# Patient Record
Sex: Male | Born: 1980 | Race: Black or African American | Hispanic: No | Smoking: Never smoker
Health system: Southern US, Community
[De-identification: ages and names within clinical notes are randomized; demographics above are authoritative.]

## PROBLEM LIST (undated history)

## (undated) DIAGNOSIS — I10 Essential (primary) hypertension: Secondary | ICD-10-CM

## (undated) DIAGNOSIS — M5416 Radiculopathy, lumbar region: Secondary | ICD-10-CM

## (undated) DIAGNOSIS — G8929 Other chronic pain: Secondary | ICD-10-CM

## (undated) DIAGNOSIS — R52 Pain, unspecified: Secondary | ICD-10-CM

## (undated) DIAGNOSIS — J45909 Unspecified asthma, uncomplicated: Secondary | ICD-10-CM

## (undated) DIAGNOSIS — N179 Acute kidney failure, unspecified: Secondary | ICD-10-CM

## (undated) DIAGNOSIS — K859 Acute pancreatitis without necrosis or infection, unspecified: Secondary | ICD-10-CM

## (undated) DIAGNOSIS — M549 Dorsalgia, unspecified: Secondary | ICD-10-CM

## (undated) DIAGNOSIS — F419 Anxiety disorder, unspecified: Secondary | ICD-10-CM

## (undated) DIAGNOSIS — G43909 Migraine, unspecified, not intractable, without status migrainosus: Secondary | ICD-10-CM

## (undated) DIAGNOSIS — N289 Disorder of kidney and ureter, unspecified: Secondary | ICD-10-CM

---

## 2013-12-31 ENCOUNTER — Encounter (HOSPITAL_COMMUNITY): Payer: Self-pay | Admitting: Emergency Medicine

## 2013-12-31 ENCOUNTER — Inpatient Hospital Stay (HOSPITAL_COMMUNITY)
Admission: EM | Admit: 2013-12-31 | Discharge: 2014-01-02 | DRG: 392 | Disposition: A | Discharge status: Home / Self Care | Payer: Medicaid - Out of State | Attending: Internal Medicine | Admitting: Internal Medicine

## 2013-12-31 ENCOUNTER — Emergency Department (HOSPITAL_COMMUNITY): Payer: Medicaid - Out of State

## 2013-12-31 DIAGNOSIS — K529 Noninfective gastroenteritis and colitis, unspecified: Secondary | ICD-10-CM | POA: Diagnosis present

## 2013-12-31 DIAGNOSIS — I10 Essential (primary) hypertension: Secondary | ICD-10-CM | POA: Diagnosis present

## 2013-12-31 DIAGNOSIS — E860 Dehydration: Secondary | ICD-10-CM | POA: Diagnosis present

## 2013-12-31 DIAGNOSIS — R1013 Epigastric pain: Secondary | ICD-10-CM

## 2013-12-31 DIAGNOSIS — J45909 Unspecified asthma, uncomplicated: Secondary | ICD-10-CM | POA: Diagnosis present

## 2013-12-31 DIAGNOSIS — R109 Unspecified abdominal pain: Secondary | ICD-10-CM | POA: Diagnosis present

## 2013-12-31 HISTORY — DX: Acute kidney failure, unspecified: N17.9

## 2013-12-31 HISTORY — DX: Disorder of kidney and ureter, unspecified: N28.9

## 2013-12-31 HISTORY — DX: Essential (primary) hypertension: I10

## 2013-12-31 LAB — URINALYSIS, ROUTINE W REFLEX MICROSCOPIC
BILIRUBIN URINE: NEGATIVE
GLUCOSE, UA: NEGATIVE mg/dL
HGB URINE DIPSTICK: NEGATIVE
Ketones, ur: NEGATIVE mg/dL
Leukocytes, UA: NEGATIVE
Nitrite: NEGATIVE
Protein, ur: NEGATIVE mg/dL
Specific Gravity, Urine: 1.02 (ref 1.005–1.030)
Urobilinogen, UA: 0.2 mg/dL (ref 0.0–1.0)
pH: 6.5 (ref 5.0–8.0)

## 2013-12-31 LAB — COMPREHENSIVE METABOLIC PANEL
ALT: 29 U/L (ref 0–53)
AST: 23 U/L (ref 0–37)
Albumin: 4.3 g/dL (ref 3.5–5.2)
Alkaline Phosphatase: 83 U/L (ref 39–117)
Anion gap: 12 (ref 5–15)
BILIRUBIN TOTAL: 0.6 mg/dL (ref 0.3–1.2)
BUN: 10 mg/dL (ref 6–23)
CHLORIDE: 101 meq/L (ref 96–112)
CO2: 27 meq/L (ref 19–32)
Calcium: 9.6 mg/dL (ref 8.4–10.5)
Creatinine, Ser: 1.29 mg/dL (ref 0.50–1.35)
GFR calc Af Amer: 83 mL/min — ABNORMAL LOW (ref >90)
GFR, EST NON AFRICAN AMERICAN: 72 mL/min — AB (ref >90)
Glucose, Bld: 96 mg/dL (ref 70–99)
Potassium: 4.1 mEq/L (ref 3.7–5.3)
SODIUM: 140 meq/L (ref 137–147)
Total Protein: 7.5 g/dL (ref 6.0–8.3)

## 2013-12-31 LAB — RAPID URINE DRUG SCREEN, HOSP PERFORMED
AMPHETAMINES: NOT DETECTED
BARBITURATES: NOT DETECTED
BENZODIAZEPINES: NOT DETECTED
Cocaine: NOT DETECTED
Opiates: NOT DETECTED
Tetrahydrocannabinol: NOT DETECTED

## 2013-12-31 LAB — GLUCOSE, CAPILLARY: Glucose-Capillary: 126 mg/dL — ABNORMAL HIGH (ref 70–99)

## 2013-12-31 LAB — CBC WITH DIFFERENTIAL/PLATELET
Basophils Absolute: 0 10*3/uL (ref 0.0–0.1)
Basophils Relative: 0 % (ref 0–1)
Eosinophils Absolute: 0.1 10*3/uL (ref 0.0–0.7)
Eosinophils Relative: 3 % (ref 0–5)
HEMATOCRIT: 38.6 % — AB (ref 39.0–52.0)
Hemoglobin: 13.6 g/dL (ref 13.0–17.0)
LYMPHS PCT: 22 % (ref 12–46)
Lymphs Abs: 1.2 10*3/uL (ref 0.7–4.0)
MCH: 29 pg (ref 26.0–34.0)
MCHC: 35.2 g/dL (ref 30.0–36.0)
MCV: 82.3 fL (ref 78.0–100.0)
MONO ABS: 0.4 10*3/uL (ref 0.1–1.0)
Monocytes Relative: 7 % (ref 3–12)
NEUTROS ABS: 3.5 10*3/uL (ref 1.7–7.7)
Neutrophils Relative %: 68 % (ref 43–77)
Platelets: 222 10*3/uL (ref 150–400)
RBC: 4.69 MIL/uL (ref 4.22–5.81)
RDW: 12.6 % (ref 11.5–15.5)
WBC: 5.2 10*3/uL (ref 4.0–10.5)

## 2013-12-31 LAB — LIPASE, BLOOD: Lipase: 58 U/L (ref 11–59)

## 2013-12-31 MED ORDER — SODIUM CHLORIDE 0.9 % IV BOLUS (SEPSIS)
1000.0000 mL | Freq: Once | INTRAVENOUS | Status: AC
Start: 2013-12-31 — End: 2013-12-31
  Administered 2013-12-31: 1000 mL via INTRAVENOUS

## 2013-12-31 MED ORDER — MOMETASONE FURO-FORMOTEROL FUM 200-5 MCG/ACT IN AERO
INHALATION_SPRAY | RESPIRATORY_TRACT | Status: AC
  Filled 2013-12-31: qty 8.8

## 2013-12-31 MED ORDER — IOHEXOL 300 MG/ML  SOLN
100.0000 mL | Freq: Once | INTRAMUSCULAR | Status: AC | PRN
  Administered 2013-12-31: 100 mL via INTRAVENOUS

## 2013-12-31 MED ORDER — ACETAMINOPHEN 650 MG RE SUPP: 650.0000 mg | Freq: Four times a day (QID) | RECTAL | Status: DC | PRN

## 2013-12-31 MED ORDER — ONDANSETRON HCL 4 MG/2ML IJ SOLN
4.0000 mg | Freq: Once | INTRAMUSCULAR | Status: AC
  Administered 2013-12-31: 4 mg via INTRAVENOUS
  Filled 2013-12-31: qty 2

## 2013-12-31 MED ORDER — HYDROMORPHONE HCL 1 MG/ML IJ SOLN
1.0000 mg | Freq: Once | INTRAMUSCULAR | Status: AC
  Administered 2013-12-31: 1 mg via INTRAVENOUS
  Filled 2013-12-31: qty 1

## 2013-12-31 MED ORDER — ENOXAPARIN SODIUM 40 MG/0.4ML ~~LOC~~ SOLN
40.0000 mg | SUBCUTANEOUS | Status: DC
  Administered 2013-12-31 – 2014-01-01 (×2): 40 mg via SUBCUTANEOUS
  Filled 2013-12-31 (×2): qty 0.4

## 2013-12-31 MED ORDER — ACETAMINOPHEN 325 MG PO TABS: 650.0000 mg | ORAL_TABLET | Freq: Four times a day (QID) | ORAL | Status: DC | PRN

## 2013-12-31 MED ORDER — QUETIAPINE FUMARATE 100 MG PO TABS
200.0000 mg | ORAL_TABLET | Freq: Every day | ORAL | Status: DC
Start: 2013-12-31 — End: 2014-01-02
  Administered 2013-12-31 – 2014-01-01 (×2): 200 mg via ORAL
  Filled 2013-12-31 (×2): qty 2

## 2013-12-31 MED ORDER — IOHEXOL 300 MG/ML  SOLN
50.0000 mL | Freq: Once | INTRAMUSCULAR | Status: AC | PRN
  Administered 2013-12-31: 50 mL via ORAL

## 2013-12-31 MED ORDER — CIPROFLOXACIN IN D5W 400 MG/200ML IV SOLN
400.0000 mg | Freq: Once | INTRAVENOUS | Status: AC
  Administered 2013-12-31: 400 mg via INTRAVENOUS
  Filled 2013-12-31: qty 200

## 2013-12-31 MED ORDER — METRONIDAZOLE IN NACL 5-0.79 MG/ML-% IV SOLN
500.0000 mg | Freq: Three times a day (TID) | INTRAVENOUS | Status: DC
  Administered 2013-12-31 – 2014-01-02 (×5): 500 mg via INTRAVENOUS
  Filled 2013-12-31 (×6): qty 100

## 2013-12-31 MED ORDER — ALUM & MAG HYDROXIDE-SIMETH 200-200-20 MG/5ML PO SUSP: 30.0000 mL | Freq: Four times a day (QID) | ORAL | Status: DC | PRN

## 2013-12-31 MED ORDER — PANTOPRAZOLE SODIUM 40 MG PO TBEC
40.0000 mg | DELAYED_RELEASE_TABLET | Freq: Every day | ORAL | Status: DC
  Administered 2014-01-01 – 2014-01-02 (×2): 40 mg via ORAL
  Filled 2013-12-31 (×2): qty 1

## 2013-12-31 MED ORDER — MORPHINE SULFATE 2 MG/ML IJ SOLN
2.0000 mg | INTRAMUSCULAR | Status: DC | PRN
  Administered 2013-12-31 – 2014-01-02 (×7): 2 mg via INTRAVENOUS
  Filled 2013-12-31 (×7): qty 1

## 2013-12-31 MED ORDER — CIPROFLOXACIN IN D5W 400 MG/200ML IV SOLN
400.0000 mg | Freq: Two times a day (BID) | INTRAVENOUS | Status: DC
  Administered 2013-12-31 – 2014-01-02 (×4): 400 mg via INTRAVENOUS
  Filled 2013-12-31 (×4): qty 200

## 2013-12-31 MED ORDER — SODIUM CHLORIDE 0.9 % IV BOLUS (SEPSIS)
1000.0000 mL | Freq: Once | INTRAVENOUS | Status: AC
  Administered 2013-12-31: 1000 mL via INTRAVENOUS

## 2013-12-31 MED ORDER — HYDROMORPHONE HCL 1 MG/ML IJ SOLN
1.0000 mg | Freq: Once | INTRAMUSCULAR | Status: AC
Start: 2013-12-31 — End: 2013-12-31
  Administered 2013-12-31: 1 mg via INTRAVENOUS
  Filled 2013-12-31: qty 1

## 2013-12-31 MED ORDER — SODIUM CHLORIDE 0.9 % IV SOLN
INTRAVENOUS | Status: DC
  Administered 2013-12-31 – 2014-01-02 (×4): via INTRAVENOUS

## 2013-12-31 MED ORDER — MOMETASONE FURO-FORMOTEROL FUM 200-5 MCG/ACT IN AERO
2.0000 | INHALATION_SPRAY | Freq: Two times a day (BID) | RESPIRATORY_TRACT | Status: DC
  Administered 2013-12-31 – 2014-01-02 (×4): 2 via RESPIRATORY_TRACT
  Filled 2013-12-31: qty 8.8

## 2013-12-31 MED ORDER — METRONIDAZOLE IN NACL 5-0.79 MG/ML-% IV SOLN: 500.0000 mg | Freq: Once | INTRAVENOUS | Status: DC

## 2013-12-31 MED ORDER — BUSPIRONE HCL 5 MG PO TABS
10.0000 mg | ORAL_TABLET | Freq: Three times a day (TID) | ORAL | Status: DC
  Administered 2013-12-31 – 2014-01-02 (×5): 10 mg via ORAL
  Filled 2013-12-31 (×5): qty 2

## 2013-12-31 MED ORDER — VERAPAMIL HCL 80 MG PO TABS
80.0000 mg | ORAL_TABLET | Freq: Three times a day (TID) | ORAL | Status: DC
  Administered 2013-12-31 – 2014-01-02 (×5): 80 mg via ORAL
  Filled 2013-12-31 (×5): qty 1

## 2013-12-31 MED ORDER — MONTELUKAST SODIUM 10 MG PO TABS
10.0000 mg | ORAL_TABLET | Freq: Every day | ORAL | Status: DC
  Administered 2013-12-31 – 2014-01-01 (×2): 10 mg via ORAL
  Filled 2013-12-31 (×2): qty 1

## 2013-12-31 MED ORDER — METOPROLOL TARTRATE 25 MG PO TABS
25.0000 mg | ORAL_TABLET | Freq: Two times a day (BID) | ORAL | Status: DC
  Administered 2013-12-31 – 2014-01-02 (×4): 25 mg via ORAL
  Filled 2013-12-31 (×4): qty 1

## 2013-12-31 MED ORDER — TIZANIDINE HCL 4 MG PO TABS
4.0000 mg | ORAL_TABLET | Freq: Three times a day (TID) | ORAL | Status: DC | PRN
  Administered 2014-01-01: 4 mg via ORAL
  Filled 2013-12-31: qty 1

## 2013-12-31 MED ORDER — OXYCODONE HCL 5 MG PO TABS
5.0000 mg | ORAL_TABLET | ORAL | Status: DC | PRN
  Administered 2013-12-31 – 2014-01-01 (×4): 5 mg via ORAL
  Filled 2013-12-31 (×4): qty 1

## 2013-12-31 MED ORDER — CITALOPRAM HYDROBROMIDE 20 MG PO TABS
40.0000 mg | ORAL_TABLET | Freq: Every day | ORAL | Status: DC
  Administered 2013-12-31 – 2014-01-01 (×2): 40 mg via ORAL
  Filled 2013-12-31 (×2): qty 2

## 2013-12-31 NOTE — ED Notes (Signed)
Pt reports abdominal pain and n/v/d x1 month. Pt reports [facility] for [facility]. Pt reports was diagnosed with pancreatitis.

## 2013-12-31 NOTE — ED Notes (Signed)
EDP aware of BP. 

## 2013-12-31 NOTE — ED Provider Notes (Signed)
CSN: [id number]     Arrival date & time 12/31/13  1100 History   First MD Initiated Contact with Patient 12/31/13 1309     Chief Complaint  Patient presents with  . Abdominal Pain     (Consider location/radiation/quality/duration/timing/severity/associated sxs/prior Treatment) HPI.... Abdominal pain, nausea, vomiting for one month. [facility] with the following diagnoses:  "Pancreatitis, pleural effusion, kidney disease"   He was recently discharged AMA.  His symptoms have persisted. He claims not to be eating or drinking. Also complains of back pain left greater than right. No fever, chills chest pain, dyspnea, dysuria.  Past Medical History  Diagnosis Date  . Hypertension   . Renal disorder   . Acute renal failure    History reviewed. No pertinent past surgical history. History reviewed. No pertinent family history. History  Substance Use Topics  . Smoking status: Never Smoker   . Smokeless tobacco: Not on file  . Alcohol Use: No    Review of Systems  All other systems reviewed and are negative.     Allergies  Adhesive; Bee venom; Dilaudid; Morphine and related; and Penicillins  Home Medications   Prior to Admission medications   Medication Sig Start Date End Date Taking? Authorizing Provider  albuterol (PROVENTIL) (2.5 MG/3ML) 0.083% nebulizer solution Inhale 2.5 mg into the lungs every 4 (four) hours as needed. 02/03/13  Yes Historical Provider, MD  albuterol (VENTOLIN HFA) 108 (90 BASE) MCG/ACT inhaler Inhale 2 puffs into the lungs every 6 (six) hours as needed. 10/23/13  Yes Historical Provider, MD  busPIRone (BUSPAR) 10 MG tablet Take 10 mg by mouth 3 (three) times daily.   Yes Historical Provider, MD  citalopram (CELEXA) 40 MG tablet Take 40 mg by mouth at bedtime.   Yes Historical Provider, MD  Fluticasone-Salmeterol (ADVAIR) 500-50 MCG/DOSE AEPB Inhale 1 puff into the lungs at bedtime.   Yes Historical  Provider, MD  montelukast (SINGULAIR) 10 MG tablet Take 10 mg by mouth at bedtime. 10/23/13  Yes Historical Provider, MD  QUEtiapine (SEROQUEL) 200 MG tablet Take 200 mg by mouth at bedtime.   Yes Historical Provider, MD  tiZANidine (ZANAFLEX) 4 MG tablet Take 4 mg by mouth every 8 (eight) hours as needed for muscle spasms.   Yes Historical Provider, MD  verapamil (CALAN) 80 MG tablet Take 80 mg by mouth 3 (three) times daily.   Yes Historical Provider, MD  metoprolol tartrate (LOPRESSOR) 25 MG tablet Take 25 mg by mouth 2 (two) times daily.    Historical Provider, MD  oxyCODONE (OXY IR/ROXICODONE) 5 MG immediate release tablet Take 5 mg by mouth. Take 1 tablet every 4 hours as needed for pain 12/16/13   Historical Provider, MD   BP 154/107 mmHg  Pulse 75  Temp(Src) 98.2 F (36.8 C) (Oral)  Resp 16  Ht 6\' 1"  (1.854 m)  Wt 180 lb (81.647 kg)  BMI 23.75 kg/m2  SpO2 98% Physical Exam  Constitutional: He is oriented to person, place, and time. He appears well-developed and well-nourished.  HENT:  Head: Normocephalic and atraumatic.  Eyes: Conjunctivae and EOM are normal. Pupils are equal, round, and reactive to light.  Neck: Normal range of motion. Neck supple.  Cardiovascular: Normal rate, regular rhythm and normal heart sounds.   Pulmonary/Chest: Effort normal and breath sounds normal.  Abdominal: Soft. Bowel sounds are normal.  Tenderness right side of abdomen  Musculoskeletal: Normal range of motion.  Neurological: He is alert and oriented  to person, place, and time.  Skin: Skin is warm and dry.  Psychiatric: He has a normal mood and affect. His behavior is normal.  Nursing note and vitals reviewed.   ED Course  Procedures (including critical care time) Labs Review Labs Reviewed  CBC WITH DIFFERENTIAL - Abnormal; Notable for the following:    HCT 38.6 (*)    All other components within normal limits  COMPREHENSIVE METABOLIC PANEL - Abnormal; Notable for the following:    GFR  calc non Af Amer 72 (*)    GFR calc Af Amer 83 (*)    All other components within normal limits  LIPASE, BLOOD  URINALYSIS, ROUTINE W REFLEX MICROSCOPIC  URINE RAPID DRUG SCREEN (HOSP PERFORMED)    Imaging Review Dg Chest 2 View  12/31/2013   CLINICAL DATA:  Acute abdominal and back pain.  EXAM: CHEST - 2 VIEW  COMPARISON:  12/31/2013  FINDINGS: Mild hyperinflation. No focal pneumonia, collapse or consolidation. Normal heart size and vascularity. No edema, effusion pneumothorax. Trachea midline.  IMPRESSION: Hyperinflation without acute process.   Electronically Signed   By: Ruel Favorsrevor  Shick M.D.   On: 12/31/2013 15:19   Ct Abdomen Pelvis W Contrast  12/31/2013   CLINICAL DATA:  Abdominal pain, nausea, vomiting and diarrhea for 1 month.  EXAM: CT ABDOMEN AND PELVIS WITH CONTRAST  TECHNIQUE: Multidetector CT imaging of the abdomen and pelvis was performed using the standard protocol following bolus administration of intravenous contrast.  CONTRAST:  50mL OMNIPAQUE IOHEXOL 300 MG/ML SOLN, 100mL OMNIPAQUE IOHEXOL 300 MG/ML SOLN  COMPARISON:  None.  FINDINGS: There are trace bilateral pleural effusions. The lung bases are clear.  The liver demonstrates no focal abnormality. There is no intrahepatic or extrahepatic biliary ductal dilatation. The gallbladder is normal. The spleen demonstrates no focal abnormality. The kidneys, adrenal glands and pancreas are normal. The bladder is unremarkable.  The stomach is distended with a majority of the oral contrast present within the stomach. The duodenum, small intestine, and large intestine demonstrate no contrast extravasation, bowel thickening or dilatation. There are a few fluid-filled loops of small bowel with air-fluid levels and fluid within the ascending colon. There is a normal caliber appendix in the right lower quadrant without periappendiceal inflammatory changes. There is no pneumoperitoneum, pneumatosis, or portal venous gas. There is no abdominal or  pelvic free fluid. There is no lymphadenopathy.  The abdominal aorta is normal in caliber.  There are no lytic or sclerotic osseous lesions. 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  IMPRESSION: 1. There are a few fluid-filled loops of small bowel with air-fluid levels and fluid within the ascending colon. This appearance can be seen with enterocolitis.   Electronically Signed   By: Elige KoHetal  Patel   On: 12/31/2013 15:06     EKG Interpretation None      MDM   Final diagnoses:  Abdominal pain    White count, hepatic functions, lipase all normal. CT scan shows air-fluid levels and fluid within the ascending colon consistent with enterocolitis. IV Flagyl, IV Cipro. Admit to general medicine.    Donnetta HutchingBrian Dshawn Mcnay, MD 12/31/13 [phone]

## 2013-12-31 NOTE — H&P (Signed)
[facility] History and Physical  Dontravious Camille ZOX:[id number] DOB: 07-26-1980 DOA: 12/31/2013  Referring physician:  PCP: No primary care provider on file.   Chief Complaint: Abdominal pain  HPI: Patrick Black is a 33 y.o. male  with a medical history of hypertension, acute renal failure, having a recent hospitalizations in [place] and [place] for abdominal pain. He states being diagnosed with pancreatitis. He reports leaving AGAINST MEDICAL ADVICE yesterday evening from a [facility], stating that he did not want another CT scan. He presents with complaints of generalized abdominal pain, becoming progressively worse over the past 2 weeks that has been associated with nausea vomiting and diarrhea. He denies bloody stools, melena, hematemesis along with fevers or chills, sick contacts or recent travel. Initial workup included a CT scan of abdomen and pelvis that showed findings suggestive of colitis. He also complains of lower back pain that he has had for greater than a month attributing it to arthritis. Blood work performed in the emergency department showed a white count of 5200, LFTs within normal limits, lipase of 58.                                                                                                                                                                                                                       Review of Systems:  Constitutional:  No weight loss, night sweats, Fevers, chills, fatigue.  HEENT:  No headaches, Difficulty swallowing,Tooth/dental problems,Sore throat,  No sneezing, itching, ear ache, nasal congestion, post nasal drip,  Cardio-vascular:  No chest pain, Orthopnea, PND, swelling in lower extremities, anasarca, dizziness, palpitations  GI:  No heartburn, indigestion, positive for abdominal pain, nausea, vomiting, diarrhea, change in bowel habits, loss of appetite  Resp:  No shortness of breath with exertion or at rest. No  excess mucus, no productive cough, No non-productive cough, No coughing up of blood.No change in color of mucus.No wheezing.No chest wall deformity  Skin:  no rash or lesions.  GU:  no dysuria, change in color of urine, no urgency or frequency. No flank pain.  Musculoskeletal:  No joint pain or swelling. No decreased range of motion. Positive for back pain  Psych:  No change in mood or affect. No depression or anxiety. No memory loss.   Past Medical History  Diagnosis Date  . Hypertension   . Renal disorder   . Acute renal failure    History reviewed. No pertinent past surgical history. Social History:  reports that he has never  smoked. He does not have any smokeless tobacco history on file. He reports that he does not drink alcohol or use illicit drugs.  Allergies  Allergen Reactions  . Adhesive [Tape] Dermatitis  . Bee Venom   . Dilaudid [Hydromorphone Hcl]     hives  . Morphine And Related     Hives   . Penicillins     Hives     History reviewed. No pertinent family history.   Prior to Admission medications   Medication Sig Start Date End Date Taking? Authorizing Provider  albuterol (PROVENTIL) (2.5 MG/3ML) 0.083% nebulizer solution Inhale 2.5 mg into the lungs every 4 (four) hours as needed. 02/03/13  Yes Historical Provider, MD  albuterol (VENTOLIN HFA) 108 (90 BASE) MCG/ACT inhaler Inhale 2 puffs into the lungs every 6 (six) hours as needed. 10/23/13  Yes Historical Provider, MD  busPIRone (BUSPAR) 10 MG tablet Take 10 mg by mouth 3 (three) times daily.   Yes Historical Provider, MD  citalopram (CELEXA) 40 MG tablet Take 40 mg by mouth at bedtime.   Yes Historical Provider, MD  Fluticasone-Salmeterol (ADVAIR) 500-50 MCG/DOSE AEPB Inhale 1 puff into the lungs at bedtime.   Yes Historical Provider, MD  montelukast (SINGULAIR) 10 MG tablet Take 10 mg by mouth at bedtime. 10/23/13  Yes Historical Provider, MD  QUEtiapine (SEROQUEL) 200 MG tablet Take 200 mg by mouth at  bedtime.   Yes Historical Provider, MD  tiZANidine (ZANAFLEX) 4 MG tablet Take 4 mg by mouth every 8 (eight) hours as needed for muscle spasms.   Yes Historical Provider, MD  verapamil (CALAN) 80 MG tablet Take 80 mg by mouth 3 (three) times daily.   Yes Historical Provider, MD  metoprolol tartrate (LOPRESSOR) 25 MG tablet Take 25 mg by mouth 2 (two) times daily.    Historical Provider, MD  oxyCODONE (OXY IR/ROXICODONE) 5 MG immediate release tablet Take 5 mg by mouth. Take 1 tablet every 4 hours as needed for pain 12/16/13   Historical Provider, MD   Physical Exam: Filed Vitals:   12/31/13 1129 12/31/13 1813  BP: 139/90 154/107  Pulse: 70 75  Temp: 98.2 F (36.8 C)   TempSrc: Oral   Resp: 18 16  Height: 6\' 1"  (1.854 m)   Weight: 81.647 kg (180 lb)   SpO2: 100% 98%    Wt Readings from Last 3 Encounters:  12/31/13 81.647 kg (180 lb)    General:  Appears calm and comfortable, no acute distress Eyes: PERRL, normal lids, irises & conjunctiva ENT: grossly normal hearing, lips & tongue Neck: no LAD, masses or thyromegaly Cardiovascular: RRR, no m/r/g. No LE edema. Telemetry: SR, no arrhythmias  Respiratory: CTA bilaterally, no w/r/r. Normal respiratory effort. Abdomen: soft, reports generalized pain with palpation, no rebound tenderness or guarding, having positive bowel sounds Skin: no rash or induration seen on limited exam Musculoskeletal: grossly normal tone BUE/BLE Psychiatric: grossly normal mood and affect, speech fluent and appropriate Neurologic: grossly non-focal.          Labs on Admission:  Basic Metabolic Panel:  Recent Labs Lab 12/31/13 1300  NA 140  K 4.1  CL 101  CO2 27  GLUCOSE 96  BUN 10  CREATININE 1.29  CALCIUM 9.6   Liver Function Tests:  Recent Labs Lab 12/31/13 1300  AST 23  ALT 29  ALKPHOS 83  BILITOT 0.6  PROT 7.5  ALBUMIN 4.3    Recent Labs Lab 12/31/13 1300  LIPASE 58   No results for input(s):  AMMONIA in the last 168  hours. CBC:  Recent Labs Lab 12/31/13 1300  WBC 5.2  NEUTROABS 3.5  HGB 13.6  HCT 38.6*  MCV 82.3  PLT 222   Cardiac Enzymes: No results for input(s): CKTOTAL, CKMB, CKMBINDEX, TROPONINI in the last 168 hours.  BNP (last 3 results) No results for input(s): PROBNP in the last 8760 hours. CBG: No results for input(s): GLUCAP in the last 168 hours.  Radiological Exams on Admission: Dg Chest 2 View  12/31/2013   CLINICAL DATA:  Acute abdominal and back pain.  EXAM: CHEST - 2 VIEW  COMPARISON:  12/31/2013  FINDINGS: Mild hyperinflation. No focal pneumonia, collapse or consolidation. Normal heart size and vascularity. No edema, effusion pneumothorax. Trachea midline.  IMPRESSION: Hyperinflation without acute process.   Electronically Signed   By: Ruel Favorsrevor  Shick M.D.   On: 12/31/2013 15:19   Ct Abdomen Pelvis W Contrast  12/31/2013   CLINICAL DATA:  Abdominal pain, nausea, vomiting and diarrhea for 1 month.  EXAM: CT ABDOMEN AND PELVIS WITH CONTRAST  TECHNIQUE: Multidetector CT imaging of the abdomen and pelvis was performed using the standard protocol following bolus administration of intravenous contrast.  CONTRAST:  50mL OMNIPAQUE IOHEXOL 300 MG/ML SOLN, 100mL OMNIPAQUE IOHEXOL 300 MG/ML SOLN  COMPARISON:  None.  FINDINGS: There are trace bilateral pleural effusions. The lung bases are clear.  The liver demonstrates no focal abnormality. There is no intrahepatic or extrahepatic biliary ductal dilatation. The gallbladder is normal. The spleen demonstrates no focal abnormality. The kidneys, adrenal glands and pancreas are normal. The bladder is unremarkable.  The stomach is distended with a majority of the oral contrast present within the stomach. The duodenum, small intestine, and large intestine demonstrate no contrast extravasation, bowel thickening or dilatation. There are a few fluid-filled loops of small bowel with air-fluid levels and fluid within the ascending colon. There is a normal  caliber appendix in the right lower quadrant without periappendiceal inflammatory changes. There is no pneumoperitoneum, pneumatosis, or portal venous gas. There is no abdominal or pelvic free fluid. There is no lymphadenopathy.  The abdominal aorta is normal in caliber.  There are no lytic or sclerotic osseous lesions. 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  IMPRESSION: 1. There are a few fluid-filled loops of small bowel with air-fluid levels and fluid within the ascending colon. This appearance can be seen with enterocolitis.   Electronically Signed   By: Elige KoHetal  Patel   On: 12/31/2013 15:06    EKG: Independently reviewed.   Assessment/Plan Principal Problem:   Abdominal pain Active Problems:   Enterocolitis   Dehydration   1. Abdominal pain. Patient reporting a two-week history of abdominal pain that has been associate with nausea vomiting and diarrhea. It appears she has had previous hospitalizations, signing out [place] MEDICAL ADVICE [place]. A CT scan performed in the emergency room today showing findings suggestive of enterocolitis. Lab work otherwise unremarkable, liver function tests, lipase, white count within normal limits. Will treat empirically with IV ciprofloxacin and Flagyl, IV fluids, supportive care, reassess in a.m. May benefit from a GI consultation if there is no improvement to his symptoms.  2. Hypertension. Will continue metoprolol 25 mg by mouth twice a day and verapamil 80 mg by mouth 3 times a day 3. Asthma. Stable, will continue inhaled steroid.    Code Status: Full code DVT Prophylaxis: Subcutaneous Lovenox Family Communication: Disposition Plan: Admit patient to the medicine service, anticipate may require greater than 2 nights  hospitalization  Time spent: 65 min  Jeralyn BennettZAMORA, Zola Runion [facility] Pager [phone]

## 2014-01-01 DIAGNOSIS — R1084 Generalized abdominal pain: Secondary | ICD-10-CM

## 2014-01-01 LAB — CBC
HEMATOCRIT: 34.4 % — AB (ref 39.0–52.0)
Hemoglobin: 12 g/dL — ABNORMAL LOW (ref 13.0–17.0)
MCH: 28.7 pg (ref 26.0–34.0)
MCHC: 34.9 g/dL (ref 30.0–36.0)
MCV: 82.3 fL (ref 78.0–100.0)
Platelets: 182 10*3/uL (ref 150–400)
RBC: 4.18 MIL/uL — ABNORMAL LOW (ref 4.22–5.81)
RDW: 12.7 % (ref 11.5–15.5)
WBC: 3.6 10*3/uL — AB (ref 4.0–10.5)

## 2014-01-01 LAB — BASIC METABOLIC PANEL
Anion gap: 10 (ref 5–15)
BUN: 11 mg/dL (ref 6–23)
CO2: 25 meq/L (ref 19–32)
CREATININE: 1.21 mg/dL (ref 0.50–1.35)
Calcium: 8.5 mg/dL (ref 8.4–10.5)
Chloride: 107 mEq/L (ref 96–112)
GFR calc Af Amer: 90 mL/min — ABNORMAL LOW (ref >90)
GFR, EST NON AFRICAN AMERICAN: 77 mL/min — AB (ref >90)
GLUCOSE: 107 mg/dL — AB (ref 70–99)
Potassium: 4.2 mEq/L (ref 3.7–5.3)
SODIUM: 142 meq/L (ref 137–147)

## 2014-01-01 LAB — C-REACTIVE PROTEIN: CRP: <0.5 mg/dL — ABNORMAL LOW (ref <0.60)

## 2014-01-01 NOTE — Progress Notes (Signed)
[facility] PROGRESS NOTE  Patrick Chamberlainimothy Black ZOX:[id number] DOB: 12/14/1980 DOA: 12/31/2013 PCP: No primary care provider on file.  Assessment/Plan: Abdominal Pain -2/2 colitis. -Improved.  Colitis -Continue IV cipro/flagyl. -Advance diet  Code Status: Full Code Family Communication: Patient only  Disposition Plan: Home in 1-2 days   Consultants:  None   Antibiotics:  Cipro  Flagyl    Subjective: Abdominal pain improved. Wants to eat.  Objective: Filed Vitals:   12/31/13 2217 01/01/14 0625 01/01/14 0731 01/01/14 1233  BP: 135/73 123/64  123/72  Pulse: 69 65  56  Temp: 98.4 F (36.9 C) 98.1 F (36.7 C)  98.5 F (36.9 C)  TempSrc: Oral Oral  Oral  Resp: 16 16  16   Height:      Weight: 79.924 kg (176 lb 3.2 oz)     SpO2: 98% 98% 95% 98%    Intake/Output Summary (Last 24 hours) at 01/01/14 1536 Last data filed at 01/01/14 1356  Gross per 24 hour  Intake    940 ml  Output    900 ml  Net     40 ml   Filed Weights   12/31/13 1129 12/31/13 2217  Weight: 81.647 kg (180 lb) 79.924 kg (176 lb 3.2 oz)    Exam:   General:  AA Ox3, NAD  Cardiovascular: RRR  Respiratory: CTA B  Abdomen: S/NT/ND/+BS  Extremities: no C/C/E   Neurologic:  Non-focal  Data Reviewed: Basic Metabolic Panel:  Recent Labs Lab 12/31/13 1300 01/01/14 0616  NA 140 142  K 4.1 4.2  CL 101 107  CO2 27 25  GLUCOSE 96 107*  BUN 10 11  CREATININE 1.29 1.21  CALCIUM 9.6 8.5   Liver Function Tests:  Recent Labs Lab 12/31/13 1300  AST 23  ALT 29  ALKPHOS 83  BILITOT 0.6  PROT 7.5  ALBUMIN 4.3    Recent Labs Lab 12/31/13 1300  LIPASE 58   No results for input(s): AMMONIA in the last 168 hours. CBC:  Recent Labs Lab 12/31/13 1300 01/01/14 0616  WBC 5.2 3.6*  NEUTROABS 3.5  --   HGB 13.6 12.0*  HCT 38.6* 34.4*  MCV 82.3 82.3  PLT 222 182   Cardiac Enzymes: No results for input(s): CKTOTAL, CKMB, CKMBINDEX, TROPONINI in the last 168  hours. BNP (last 3 results) No results for input(s): PROBNP in the last 8760 hours. CBG:  Recent Labs Lab 12/31/13 2053  GLUCAP 126*    No results found for this or any previous visit (from the past 240 hour(s)).   Studies: Dg Chest 2 View  12/31/2013   CLINICAL DATA:  Acute abdominal and back pain.  EXAM: CHEST - 2 VIEW  COMPARISON:  12/31/2013  FINDINGS: Mild hyperinflation. No focal pneumonia, collapse or consolidation. Normal heart size and vascularity. No edema, effusion pneumothorax. Trachea midline.  IMPRESSION: Hyperinflation without acute process.   Electronically Signed   By: Ruel Favorsrevor  Shick M.D.   On: 12/31/2013 15:19   Ct Abdomen Pelvis W Contrast  12/31/2013   CLINICAL DATA:  Abdominal pain, nausea, vomiting and diarrhea for 1 month.  EXAM: CT ABDOMEN AND PELVIS WITH CONTRAST  TECHNIQUE: Multidetector CT imaging of the abdomen and pelvis was performed using the standard protocol following bolus administration of intravenous contrast.  CONTRAST:  50mL OMNIPAQUE IOHEXOL 300 MG/ML SOLN, 100mL OMNIPAQUE IOHEXOL 300 MG/ML SOLN  COMPARISON:  None.  FINDINGS: There are trace bilateral pleural effusions. The lung bases are clear.  The liver demonstrates no focal  abnormality. There is no intrahepatic or extrahepatic biliary ductal dilatation. The gallbladder is normal. The spleen demonstrates no focal abnormality. The kidneys, adrenal glands and pancreas are normal. The bladder is unremarkable.  The stomach is distended with a majority of the oral contrast present within the stomach. The duodenum, small intestine, and large intestine demonstrate no contrast extravasation, bowel thickening or dilatation. There are a few fluid-filled loops of small bowel with air-fluid levels and fluid within the ascending colon. There is a normal caliber appendix in the right lower quadrant without periappendiceal inflammatory changes. There is no pneumoperitoneum, pneumatosis, or portal venous gas. There is no  abdominal or pelvic free fluid. There is no lymphadenopathy.  The abdominal aorta is normal in caliber.  There are no lytic or sclerotic osseous lesions. 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  IMPRESSION: 1. There are a few fluid-filled loops of small bowel with air-fluid levels and fluid within the ascending colon. This appearance can be seen with enterocolitis.   Electronically Signed   By: Elige KoHetal  Patel   On: 12/31/2013 15:06    Scheduled Meds: . busPIRone  10 mg Oral TID  . ciprofloxacin  400 mg Intravenous Q12H  . citalopram  40 mg Oral QHS  . enoxaparin (LOVENOX) injection  40 mg Subcutaneous Q24H  . metoprolol tartrate  25 mg Oral BID  . metronidazole  500 mg Intravenous Q8H  . mometasone-formoterol  2 puff Inhalation BID  . montelukast  10 mg Oral QHS  . pantoprazole  40 mg Oral Daily  . QUEtiapine  200 mg Oral QHS  . verapamil  80 mg Oral TID   Continuous Infusions: . sodium chloride 125 mL/hr at 01/01/14 [id number]    Principal Problem:   Abdominal pain Active Problems:   Enterocolitis   Dehydration    Time spent: 35 minutes. Greater than 50% of this time was spent in direct contact with the patient coordinating care.    Chaya JanHERNANDEZ ACOSTA,Duwayne Matters  [facility] Pager [phone]  If 7PM-7AM, please contact night-coverage at [email or web address], password [email or web address] 01/01/2014, 3:36 PM  LOS: 1 day

## 2014-01-01 NOTE — Plan of Care (Signed)
Problem: Phase I Progression Outcomes Goal: Pain controlled with appropriate interventions Outcome: Progressing Goal: OOB as tolerated unless otherwise ordered Outcome: Progressing     

## 2014-01-01 NOTE — Progress Notes (Signed)
UR chart review completed.  

## 2014-01-01 NOTE — Care Management Note (Addendum)
    Page 1 of 1   01/02/2014     11:48:17 AM CARE MANAGEMENT NOTE 01/02/2014  Patient:  Wanita ChamberlainROYAL,Erbie   Account Number:  [id number]  Date Initiated:  01/01/2014  Documentation initiated by:  Sharrie RothmanBLACKWELL,Merie Wulf C  Subjective/Objective Assessment:   Pt admitted from home with abd pain. Pt lives with his significant other and will return home at discharge. Pt has a walker for home use.     Action/Plan:   No CM needs noted.   Anticipated DC Date:  01/03/2014   Anticipated DC Plan:  HOME/SELF CARE      DC Planning Services  CM consult      Choice offered to / List presented to:             Status of service:  Completed, signed off Medicare Important Message given?   (If response is "NO", the following Medicare IM given date fields will be blank) Date Medicare IM given:   Medicare IM given by:   Date Additional Medicare IM given:   Additional Medicare IM given by:    Discharge Disposition:  HOME/SELF CARE  Per UR Regulation:    If discussed at Long Length of Stay Meetings, dates discussed:    Comments:  01/02/14 1145 Arlyss Queenammy Zadyn Yardley, RN BSN CM Pt discharged home today. Pt given list of PCP accepting pts. No other CM needs noted.  01/01/14 1050 Arlyss Queenammy Daniesha Driver, RN BSN CM

## 2014-01-02 MED ORDER — ONDANSETRON 8 MG PO TBDP: 8.0000 mg | ORAL_TABLET | Freq: Three times a day (TID) | ORAL | Status: DC | PRN

## 2014-01-02 MED ORDER — CIPROFLOXACIN HCL 500 MG PO TABS: 500.0000 mg | ORAL_TABLET | Freq: Two times a day (BID) | ORAL | Status: DC

## 2014-01-02 MED ORDER — METRONIDAZOLE 500 MG PO TABS: 500.0000 mg | ORAL_TABLET | Freq: Three times a day (TID) | ORAL | Status: DC

## 2014-01-02 MED ORDER — OXYCODONE HCL 5 MG PO TABS
5.0000 mg | ORAL_TABLET | Freq: Four times a day (QID) | ORAL | Status: DC | PRN
Start: 2014-01-02 — End: 2022-10-04

## 2014-01-02 NOTE — Progress Notes (Signed)
Mr. Karl BalesRoyal was given discharge instructions. He verbalized understanding of information reviewed such as when to set up a follow appointment, when to take medications, and new medications prescribed. Pt IV was removed intact per protocol. Pt was stable leaving the unit via wheelchair with family and nursing staff.  Starla LinkLewis, Abdirizak Richison J, RN

## 2014-01-02 NOTE — Discharge Summary (Signed)
Physician Discharge Summary  Patrick Chamberlainimothy Bendix AVW:[id number] DOB: 10/12/1980 DOA: 12/31/2013  PCP: No primary care provider on file.  Admit date: 12/31/2013 Discharge date: 01/02/2014  Time spent: 45 minutes  Recommendations for Outpatient Follow-up:  -Will be discharged home today. -Advised to follow up with PCP in 2 weeks.   Discharge Diagnoses:  Principal Problem:   Abdominal pain Active Problems:   Enterocolitis   Dehydration   Discharge Condition: Stable and improved  Filed Weights   12/31/13 1129 12/31/13 2217  Weight: 81.647 kg (180 lb) 79.924 kg (176 lb 3.2 oz)    History of present illness:  Patrick Black is a 33 y.o. male with a medical history of hypertension, acute renal failure, having a recent hospitalizations in [place] and [place] for abdominal pain. He states being diagnosed with pancreatitis. He reports leaving AGAINST MEDICAL ADVICE yesterday evening [facility], stating that he did not want another CT scan. He presents with complaints of generalized abdominal pain, becoming progressively worse over the past 2 weeks that has been associated with nausea vomiting and diarrhea. He denies bloody stools, melena, hematemesis along with fevers or chills, sick contacts or recent travel. Initial workup included a CT scan of abdomen and pelvis that showed findings suggestive of colitis. He also complains of lower back pain that he has had for greater than a month attributing it to arthritis. Blood work performed in the emergency department showed a white count of 5200, LFTs within normal limits, lipase of 58.   Hospital Course:   Abdominal Pain -2/2 colitis. -Improved.  Colitis -Transition to PO cipro/flagyl for 2 weeks. -Tolerating solid diet without issues and had a BM today. -Short prescription for pain and nausea provided.  Procedures:  None    Consultations:  None  Discharge Instructions  Discharge Instructions    Increase activity slowly    Complete by:  As directed             Medication List    TAKE these medications        albuterol (2.5 MG/3ML) 0.083% nebulizer solution  Commonly known as:  PROVENTIL  Inhale 2.5 mg into the lungs every 4 (four) hours as needed.     VENTOLIN HFA 108 (90 BASE) MCG/ACT inhaler  Generic drug:  albuterol  Inhale 2 puffs into the lungs every 6 (six) hours as needed.     busPIRone 10 MG tablet  Commonly known as:  BUSPAR  Take 10 mg by mouth 3 (three) times daily.     ciprofloxacin 500 MG tablet  Commonly known as:  CIPRO  Take 1 tablet (500 mg total) by mouth 2 (two) times daily.     citalopram 40 MG tablet  Commonly known as:  CELEXA  Take 40 mg by mouth at bedtime.     Fluticasone-Salmeterol 500-50 MCG/DOSE Aepb  Commonly known as:  ADVAIR  Inhale 1 puff into the lungs at bedtime.     metoprolol tartrate 25 MG tablet  Commonly known as:  LOPRESSOR  Take 25 mg by mouth 2 (two) times daily.     metroNIDAZOLE 500 MG tablet  Commonly known as:  FLAGYL  Take 1 tablet (500 mg total) by mouth 3 (three) times daily.     ondansetron 8 MG disintegrating tablet  Commonly known as:  ZOFRAN ODT  Take 1 tablet (8 mg total) by mouth every 8 (eight) hours as needed for nausea or vomiting.     oxyCODONE 5 MG immediate release tablet  Commonly known as:  Oxy IR/ROXICODONE  Take 1 tablet (5 mg total) by mouth every 6 (six) hours as needed for severe pain. Take 1 tablet every 4 hours as needed for pain     QUEtiapine 200 MG tablet  Commonly known as:  SEROQUEL  Take 200 mg by mouth at bedtime.     SINGULAIR 10 MG tablet  Generic drug:  montelukast  Take 10 mg by mouth at bedtime.     tiZANidine 4 MG tablet  Commonly known as:  ZANAFLEX  Take 4 mg by mouth every 8 (eight) hours as needed for muscle spasms.     verapamil 80 MG tablet  Commonly known as:  CALAN  Take 80  mg by mouth 3 (three) times daily.       Allergies  Allergen Reactions  . Adhesive [Tape] Dermatitis  . Bee Venom   . Penicillins     Hives        Follow-up Information    Schedule an appointment as soon as possible for a visit in 2 weeks to follow up.   Why:  with new provider       The results of significant diagnostics from this hospitalization (including imaging, microbiology, ancillary and laboratory) are listed below for reference.    Significant Diagnostic Studies: Dg Chest 2 View  12/31/2013   CLINICAL DATA:  Acute abdominal and back pain.  EXAM: CHEST - 2 VIEW  COMPARISON:  12/31/2013  FINDINGS: Mild hyperinflation. No focal pneumonia, collapse or consolidation. Normal heart size and vascularity. No edema, effusion pneumothorax. Trachea midline.  IMPRESSION: Hyperinflation without acute process.   Electronically Signed   By: Ruel Favorsrevor  Shick M.D.   On: 12/31/2013 15:19   Ct Abdomen Pelvis W Contrast  12/31/2013   CLINICAL DATA:  Abdominal pain, nausea, vomiting and diarrhea for 1 month.  EXAM: CT ABDOMEN AND PELVIS WITH CONTRAST  TECHNIQUE: Multidetector CT imaging of the abdomen and pelvis was performed using the standard protocol following bolus administration of intravenous contrast.  CONTRAST:  50mL OMNIPAQUE IOHEXOL 300 MG/ML SOLN, 100mL OMNIPAQUE IOHEXOL 300 MG/ML SOLN  COMPARISON:  None.  FINDINGS: There are trace bilateral pleural effusions. The lung bases are clear.  The liver demonstrates no focal abnormality. There is no intrahepatic or extrahepatic biliary ductal dilatation. The gallbladder is normal. The spleen demonstrates no focal abnormality. The kidneys, adrenal glands and pancreas are normal. The bladder is unremarkable.  The stomach is distended with a majority of the oral contrast present within the stomach. The duodenum, small intestine, and large intestine demonstrate no contrast extravasation, bowel thickening or dilatation. There are a few fluid-filled loops  of small bowel with air-fluid levels and fluid within the ascending colon. There is a normal caliber appendix in the right lower quadrant without periappendiceal inflammatory changes. There is no pneumoperitoneum, pneumatosis, or portal venous gas. There is no abdominal or pelvic free fluid. There is no lymphadenopathy.  The abdominal aorta is normal in caliber.  There are no lytic or sclerotic osseous lesions. 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  IMPRESSION: 1. There are a few fluid-filled loops of small bowel with air-fluid levels and fluid within the ascending colon. This appearance can be seen with enterocolitis.   Electronically Signed   By: Elige KoHetal  Patel   On: 12/31/2013 15:06    Microbiology: No results found for this or any previous visit (from the past 240 hour(s)).   Labs: Basic Metabolic Panel:  Recent Labs Lab 12/31/13 1300  01/01/14 0616  NA 140 142  K 4.1 4.2  CL 101 107  CO2 27 25  GLUCOSE 96 107*  BUN 10 11  CREATININE 1.29 1.21  CALCIUM 9.6 8.5   Liver Function Tests:  Recent Labs Lab 12/31/13 1300  AST 23  ALT 29  ALKPHOS 83  BILITOT 0.6  PROT 7.5  ALBUMIN 4.3    Recent Labs Lab 12/31/13 1300  LIPASE 58   No results for input(s): AMMONIA in the last 168 hours. CBC:  Recent Labs Lab 12/31/13 1300 01/01/14 0616  WBC 5.2 3.6*  NEUTROABS 3.5  --   HGB 13.6 12.0*  HCT 38.6* 34.4*  MCV 82.3 82.3  PLT 222 182   Cardiac Enzymes: No results for input(s): CKTOTAL, CKMB, CKMBINDEX, TROPONINI in the last 168 hours. BNP: BNP (last 3 results) No results for input(s): PROBNP in the last 8760 hours. CBG:  Recent Labs Lab 12/31/13 2053  GLUCAP 126*       Signed:  Chaya Jan  [facility] Pager: [phone] 01/02/2014, 3:37 PM

## 2014-02-06 ENCOUNTER — Encounter (HOSPITAL_COMMUNITY): Payer: Self-pay | Admitting: Cardiology

## 2014-02-06 ENCOUNTER — Emergency Department (HOSPITAL_COMMUNITY): Payer: Medicaid - Out of State

## 2014-02-06 ENCOUNTER — Emergency Department (HOSPITAL_COMMUNITY)
Admission: EM | Admit: 2014-02-06 | Discharge: 2014-02-06 | Disposition: A | Discharge status: Home / Self Care | Payer: Medicaid - Out of State | Attending: Emergency Medicine | Admitting: Emergency Medicine

## 2014-02-06 DIAGNOSIS — G43909 Migraine, unspecified, not intractable, without status migrainosus: Secondary | ICD-10-CM | POA: Insufficient documentation

## 2014-02-06 DIAGNOSIS — M545 Low back pain: Secondary | ICD-10-CM | POA: Diagnosis not present

## 2014-02-06 DIAGNOSIS — Z79899 Other long term (current) drug therapy: Secondary | ICD-10-CM | POA: Diagnosis not present

## 2014-02-06 DIAGNOSIS — Z8719 Personal history of other diseases of the digestive system: Secondary | ICD-10-CM | POA: Diagnosis not present

## 2014-02-06 DIAGNOSIS — R109 Unspecified abdominal pain: Secondary | ICD-10-CM | POA: Insufficient documentation

## 2014-02-06 DIAGNOSIS — Z7951 Long term (current) use of inhaled steroids: Secondary | ICD-10-CM | POA: Insufficient documentation

## 2014-02-06 DIAGNOSIS — Z792 Long term (current) use of antibiotics: Secondary | ICD-10-CM | POA: Insufficient documentation

## 2014-02-06 DIAGNOSIS — I10 Essential (primary) hypertension: Secondary | ICD-10-CM | POA: Diagnosis not present

## 2014-02-06 DIAGNOSIS — J45909 Unspecified asthma, uncomplicated: Secondary | ICD-10-CM | POA: Diagnosis not present

## 2014-02-06 DIAGNOSIS — F419 Anxiety disorder, unspecified: Secondary | ICD-10-CM | POA: Diagnosis not present

## 2014-02-06 DIAGNOSIS — M549 Dorsalgia, unspecified: Secondary | ICD-10-CM

## 2014-02-06 DIAGNOSIS — Z880 Allergy status to penicillin: Secondary | ICD-10-CM | POA: Diagnosis not present

## 2014-02-06 DIAGNOSIS — Z87448 Personal history of other diseases of urinary system: Secondary | ICD-10-CM | POA: Diagnosis not present

## 2014-02-06 DIAGNOSIS — G8929 Other chronic pain: Secondary | ICD-10-CM | POA: Insufficient documentation

## 2014-02-06 DIAGNOSIS — R1084 Generalized abdominal pain: Secondary | ICD-10-CM | POA: Diagnosis present

## 2014-02-06 HISTORY — DX: Anxiety disorder, unspecified: F41.9

## 2014-02-06 HISTORY — DX: Other chronic pain: G89.29

## 2014-02-06 HISTORY — DX: Unspecified asthma, uncomplicated: J45.909

## 2014-02-06 HISTORY — DX: Dorsalgia, unspecified: M54.9

## 2014-02-06 HISTORY — DX: Pain, unspecified: R52

## 2014-02-06 HISTORY — DX: Radiculopathy, lumbar region: M54.16

## 2014-02-06 HISTORY — DX: Migraine, unspecified, not intractable, without status migrainosus: G43.909

## 2014-02-06 HISTORY — DX: Acute pancreatitis without necrosis or infection, unspecified: K85.90

## 2014-02-06 LAB — COMPREHENSIVE METABOLIC PANEL
ALT: 89 U/L — AB (ref 0–53)
AST: 46 U/L — AB (ref 0–37)
Albumin: 4.3 g/dL (ref 3.5–5.2)
Alkaline Phosphatase: 70 U/L (ref 39–117)
Anion gap: 6 (ref 5–15)
BILIRUBIN TOTAL: 0.5 mg/dL (ref 0.3–1.2)
BUN: 14 mg/dL (ref 6–23)
CHLORIDE: 109 meq/L (ref 96–112)
CO2: 25 mmol/L (ref 19–32)
Calcium: 9 mg/dL (ref 8.4–10.5)
Creatinine, Ser: 1.2 mg/dL (ref 0.50–1.35)
GFR, EST AFRICAN AMERICAN: 90 mL/min — AB (ref >90)
GFR, EST NON AFRICAN AMERICAN: 78 mL/min — AB (ref >90)
Glucose, Bld: 106 mg/dL — ABNORMAL HIGH (ref 70–99)
Potassium: 3.8 mmol/L (ref 3.5–5.1)
SODIUM: 140 mmol/L (ref 135–145)
Total Protein: 7 g/dL (ref 6.0–8.3)

## 2014-02-06 LAB — CBC WITH DIFFERENTIAL/PLATELET
Basophils Absolute: 0 10*3/uL (ref 0.0–0.1)
Basophils Relative: 0 % (ref 0–1)
Eosinophils Absolute: 0.2 10*3/uL (ref 0.0–0.7)
Eosinophils Relative: 5 % (ref 0–5)
HEMATOCRIT: 37.3 % — AB (ref 39.0–52.0)
HEMOGLOBIN: 12.5 g/dL — AB (ref 13.0–17.0)
LYMPHS ABS: 1.1 10*3/uL (ref 0.7–4.0)
Lymphocytes Relative: 28 % (ref 12–46)
MCH: 27.8 pg (ref 26.0–34.0)
MCHC: 33.5 g/dL (ref 30.0–36.0)
MCV: 83.1 fL (ref 78.0–100.0)
MONO ABS: 0.4 10*3/uL (ref 0.1–1.0)
MONOS PCT: 10 % (ref 3–12)
NEUTROS ABS: 2.4 10*3/uL (ref 1.7–7.7)
Neutrophils Relative %: 57 % (ref 43–77)
Platelets: 169 10*3/uL (ref 150–400)
RBC: 4.49 MIL/uL (ref 4.22–5.81)
RDW: 12.5 % (ref 11.5–15.5)
WBC: 4.1 10*3/uL (ref 4.0–10.5)

## 2014-02-06 LAB — POC OCCULT BLOOD, ED: FECAL OCCULT BLD: NEGATIVE

## 2014-02-06 LAB — LIPASE, BLOOD: Lipase: 36 U/L (ref 11–59)

## 2014-02-06 MED ORDER — PROMETHAZINE HCL 25 MG PO TABS: 25.0000 mg | ORAL_TABLET | Freq: Four times a day (QID) | ORAL | Status: DC | PRN

## 2014-02-06 MED ORDER — SODIUM CHLORIDE 0.9 % IV SOLN
INTRAVENOUS | Status: DC
  Administered 2014-02-06: 10:00:00 via INTRAVENOUS

## 2014-02-06 MED ORDER — ONDANSETRON HCL 4 MG/2ML IJ SOLN
4.0000 mg | INTRAMUSCULAR | Status: DC | PRN
  Administered 2014-02-06: 4 mg via INTRAVENOUS
  Filled 2014-02-06: qty 2

## 2014-02-06 MED ORDER — IOHEXOL 300 MG/ML  SOLN
100.0000 mL | Freq: Once | INTRAMUSCULAR | Status: AC | PRN
  Administered 2014-02-06: 100 mL via INTRAVENOUS

## 2014-02-06 MED ORDER — MORPHINE SULFATE 4 MG/ML IJ SOLN
4.0000 mg | INTRAMUSCULAR | Status: DC | PRN
  Administered 2014-02-06: 4 mg via INTRAVENOUS
  Filled 2014-02-06 (×2): qty 1

## 2014-02-06 MED ORDER — IOHEXOL 300 MG/ML  SOLN
50.0000 mL | Freq: Once | INTRAMUSCULAR | Status: AC | PRN
  Administered 2014-02-06: 50 mL via ORAL

## 2014-02-06 NOTE — ED Notes (Signed)
Pt unable to provide stool specimen at this time.

## 2014-02-06 NOTE — ED Provider Notes (Signed)
CSN: [id number]     Arrival date & time 02/06/14  0909 History   First MD Initiated Contact with Patient 02/06/14 0914     Chief Complaint  Patient presents with  . Abdominal Pain      HPI  Pt was seen at 0940.  Per pt, c/o gradual onset and persistence of constant acute flair of his chronic generalized abd "pain" for the past 3 to 4 months.  Has been associated with 3 to 4 daily episodes of "loose stools" for the past 3 to 4 months, as well as N/V that began today.  Describes the abd pain as "cramping."  Denies diarrhea, no fevers, no back pain, no rash, no CP/SOB, no black or blood in stools or emesis.  Pt also c/o Per pt, c/o gradual onset and persistence of constant acute flair of his chronic low back "pain" for the past several months.  Denies any change in his usual chronic pain pattern.  Pain worsens with palpation of the area and body position changes. Denies incont/retention of bowel or bladder, no saddle anesthesia, no focal motor weakness, no tingling/numbness in extremities, no fevers, no injury, no abd pain.  The patient has a significant history of similar symptoms (LBP and abd pain) previously, recently being evaluated for this complaint and multiple prior evals for same.     Past Medical History  Diagnosis Date  . Hypertension   . Renal disorder   . Acute renal failure   . Chronic back pain   . Pancreatitis   . Asthma   . Migraine headache   . Anxiety   . Chronic lumbar radiculopathy   . Pain management    History reviewed. No pertinent past surgical history.  History  Substance Use Topics  . Smoking status: Never Smoker   . Smokeless tobacco: Not on file  . Alcohol Use: No    Review of Systems ROS: Statement: All systems negative except as marked or noted in the HPI; Constitutional: Negative for fever and chills. ; ; Eyes: Negative for eye pain, redness and discharge. ; ; ENMT: Negative for ear pain, hoarseness, nasal congestion, sinus pressure and sore throat. ;  ; Cardiovascular: Negative for chest pain, palpitations, diaphoresis, dyspnea and peripheral edema. ; ; Respiratory: Negative for cough, wheezing and stridor. ; ; Gastrointestinal: +N/V, abd pain, "loose stools." Negative for diarrhea, blood in stool, hematemesis, jaundice and rectal bleeding. . ; ; Genitourinary: Negative for dysuria, flank pain and hematuria. ; ; Musculoskeletal: Negative for back pain and neck pain. Negative for swelling and trauma.; ; Skin: Negative for pruritus, rash, abrasions, blisters, bruising and skin lesion.; ; Neuro: Negative for headache, lightheadedness and neck stiffness. Negative for weakness, altered level of consciousness , altered mental status, extremity weakness, paresthesias, involuntary movement, seizure and syncope.     Allergies  Adhesive; Bee venom; and Penicillins  Home Medications   Prior to Admission medications   Medication Sig Start Date End Date Taking? Authorizing Provider  busPIRone (BUSPAR) 10 MG tablet Take 10 mg by mouth 3 (three) times daily.   Yes Historical Provider, MD  citalopram (CELEXA) 40 MG tablet Take 40 mg by mouth at bedtime.   Yes Historical Provider, MD  Fluticasone-Salmeterol (ADVAIR) 500-50 MCG/DOSE AEPB Inhale 1 puff into the lungs at bedtime.   Yes Historical Provider, MD  metoprolol tartrate (LOPRESSOR) 25 MG tablet Take 25 mg by mouth 2 (two) times daily.   Yes Historical Provider, MD  metroNIDAZOLE (FLAGYL) 500 MG tablet Take 1  tablet (500 mg total) by mouth 3 (three) times daily. 01/02/14  Yes Henderson Cloud, MD  montelukast (SINGULAIR) 10 MG tablet Take 10 mg by mouth at bedtime. 10/23/13  Yes Historical Provider, MD  oxyCODONE (OXY IR/ROXICODONE) 5 MG immediate release tablet Take 1 tablet (5 mg total) by mouth every 6 (six) hours as needed for severe pain. Take 1 tablet every 4 hours as needed for pain 01/02/14  Yes Estela Isaiah Blakes, MD  QUEtiapine (SEROQUEL) 300 MG tablet Take 300 mg by mouth at bedtime.    Yes Historical Provider, MD  verapamil (CALAN) 80 MG tablet Take 80 mg by mouth at bedtime.    Yes Historical Provider, MD  albuterol (PROVENTIL) (2.5 MG/3ML) 0.083% nebulizer solution Inhale 2.5 mg into the lungs every 4 (four) hours as needed for wheezing or shortness of breath.  02/03/13   Historical Provider, MD  albuterol (VENTOLIN HFA) 108 (90 BASE) MCG/ACT inhaler Inhale 2 puffs into the lungs every 6 (six) hours as needed for wheezing or shortness of breath.  10/23/13   Historical Provider, MD  ciprofloxacin (CIPRO) 500 MG tablet Take 1 tablet (500 mg total) by mouth 2 (two) times daily. Patient not taking: Reported on 02/06/2014 01/02/14   Henderson Cloud, MD  ondansetron (ZOFRAN ODT) 8 MG disintegrating tablet Take 1 tablet (8 mg total) by mouth every 8 (eight) hours as needed for nausea or vomiting. Patient not taking: Reported on 02/06/2014 01/02/14   Henderson Cloud, MD  tiZANidine (ZANAFLEX) 4 MG tablet Take 4 mg by mouth every 8 (eight) hours as needed for muscle spasms.    Historical Provider, MD   BP 135/90 mmHg  Pulse 70  Temp(Src) 98.1 F (36.7 C) (Oral)  Resp 18  Ht  (1.854 m)  Wt 172 lb (78.019 kg)  BMI 22.70 kg/m2  SpO2 98% Physical Exam  0945: Physical examination:  Nursing notes reviewed; Vital signs and O2 SAT reviewed;  Constitutional: Well developed, Well nourished, Well hydrated, In no acute distress; Head:  Normocephalic, atraumatic; Eyes: EOMI, PERRL, No scleral icterus; ENMT: Mouth and pharynx normal, Mucous membranes moist; Neck: Supple, Full range of motion, No lymphadenopathy; Cardiovascular: Regular rate and rhythm, No murmur, rub, or gallop; Respiratory: Breath sounds clear & equal bilaterally, No rales, rhonchi, wheezes.  Speaking full sentences with ease, Normal respiratory effort/excursion; Chest: Nontender, Movement normal; Abdomen: Soft, +diffuse tenderness to palp. No rebound or guarding. Nondistended, Normal bowel sounds;  Genitourinary: No CVA tenderness; Spine:  No midline CS, TS, LS tenderness.;; Extremities: Pulses normal, No tenderness, No edema, No calf edema or asymmetry.; Neuro: AA&Ox3, Major CN grossly intact.  Speech clear. No gross focal motor or sensory deficits in extremities.; Skin: Color normal, Warm, Dry.    ED Course  Procedures     EKG Interpretation None      MDM  MDM Reviewed: previous chart, nursing note and vitals Reviewed previous: labs and CT scan Interpretation: labs, CT scan and x-ray     Results for orders placed or performed during the hospital encounter of 02/06/14  Comprehensive metabolic panel  Result Value Ref Range   Sodium 140 135 - 145 mmol/L   Potassium 3.8 3.5 - 5.1 mmol/L   Chloride 109 96 - 112 mEq/L   CO2 25 19 - 32 mmol/L   Glucose, Bld 106 (H) 70 - 99 mg/dL   BUN 14 6 - 23 mg/dL   Creatinine, Ser 4.09 0.50 - 1.35 mg/dL  Calcium 9.0 8.4 - 10.5 mg/dL   Total Protein 7.0 6.0 - 8.3 g/dL   Albumin 4.3 3.5 - 5.2 g/dL   AST 46 (H) 0 - 37 U/L   ALT 89 (H) 0 - 53 U/L   Alkaline Phosphatase 70 39 - 117 U/L   Total Bilirubin 0.5 0.3 - 1.2 mg/dL   GFR calc non Af Amer 78 (L) >90 mL/min   GFR calc Af Amer 90 (L) >90 mL/min   Anion gap 6 5 - 15  Lipase, blood  Result Value Ref Range   Lipase 36 11 - 59 U/L  CBC with Differential  Result Value Ref Range   WBC 4.1 4.0 - 10.5 K/uL   RBC 4.49 4.22 - 5.81 MIL/uL   Hemoglobin 12.5 (L) 13.0 - 17.0 g/dL   HCT 16.137.3 (L) 09.639.0 - 04.552.0 %   MCV 83.1 78.0 - 100.0 fL   MCH 27.8 26.0 - 34.0 pg   MCHC 33.5 30.0 - 36.0 g/dL   RDW 40.912.5 81.111.5 - 91.415.5 %   Platelets 169 150 - 400 K/uL   Neutrophils Relative % 57 43 - 77 %   Neutro Abs 2.4 1.7 - 7.7 K/uL   Lymphocytes Relative 28 12 - 46 %   Lymphs Abs 1.1 0.7 - 4.0 K/uL   Monocytes Relative 10 3 - 12 %   Monocytes Absolute 0.4 0.1 - 1.0 K/uL   Eosinophils Relative 5 0 - 5 %   Eosinophils Absolute 0.2 0.0 - 0.7 K/uL   Basophils Relative 0 0 - 1 %   Basophils Absolute 0.0  0.0 - 0.1 K/uL  POC occult blood, ED  Result Value Ref Range   Fecal Occult Bld NEGATIVE NEGATIVE   Dg Chest 2 View 02/06/2014   CLINICAL DATA:  Abdominal pain, chest pain. Former smoker. History of hypertension.  EXAM: CHEST  2 VIEW  COMPARISON:  12/31/2013  FINDINGS: The heart size and mediastinal contours are within normal limits. Both lungs are clear. The visualized skeletal structures are unremarkable.  IMPRESSION: No active cardiopulmonary disease.   Electronically Signed   By: Charlett NoseKevin  Dover M.D.   On: 02/06/2014 11:18   Ct Abdomen Pelvis W Contrast 02/06/2014   CLINICAL DATA:  Lower abdominal pain and diarrhea for 1 day. History of diverticulitis and pancreatitis.  EXAM: CT ABDOMEN AND PELVIS WITH CONTRAST  TECHNIQUE: Multidetector CT imaging of the abdomen and pelvis was performed using the standard protocol following bolus administration of intravenous contrast.  CONTRAST:  50 mL OMNIPAQUE IOHEXOL 300 MG/ML SOLN, 100 mL OMNIPAQUE IOHEXOL 300 MG/ML SOLN  COMPARISON:  CT abdomen and pelvis 12/31/2013.  FINDINGS: The lung bases are clear.  No pleural or pericardial effusion.  The stomach, small and large bowel and appendix all appear normal. Small volume of free pelvic fluid seen on the comparison examination has resolved. There is no lymphadenopathy.  The gallbladder, liver, spleen, adrenal glands, pancreas, biliary tree and kidneys all appear normal. No bony abnormality is identified.  IMPRESSION: Negative CT abdomen and pelvis.   Electronically Signed   By: Drusilla Kannerhomas  Dalessio M.D.   On: 02/06/2014 11:13    1230:  Pt has tol PO well while in the ED without N/V.  No stooling while in the ED.  Abd benign, VSS. Feels better and wants to go home now. Pt will not be rx narcotics, as he is already has a pain management doctor in [place]; pt and family verb understanding. Dx and testing d/w pt and  family.  Questions answered.  Verb understanding, agreeable to d/c home with outpt f/u.    Samuel Jester, DO 02/08/14 2113

## 2014-02-06 NOTE — ED Notes (Signed)
Hemoccult negative.

## 2014-02-06 NOTE — ED Notes (Signed)
Abdominal pain and lower back pain,  Diarrhea since last night.  States he has had diverticulitis and that this feels the same.

## 2014-02-06 NOTE — Discharge Instructions (Signed)
°Emergency Department Resource Guide °1) Find a Doctor and Pay Out of Pocket °Although you won't have to find out who is covered by your insurance plan, it is a good idea to ask around and get recommendations. You will then need to call the office and see if the doctor you have chosen will accept you as a new patient and what types of options they offer for patients who are self-pay. Some doctors offer discounts or will set up payment plans for their patients who do not have insurance, but you will need to ask so you aren't surprised when you get to your appointment. ° °2) Contact Your Local Health Department °Not all health departments have doctors that can see patients for sick visits, but many do, so it is worth a call to see if yours does. If you don't know where your local health department is, you can check in your phone book. The CDC also has a tool to help you locate your state's health department, and many state websites also have listings of all of their local health departments. ° °3) Find a Walk-in Clinic °If your illness is not likely to be very severe or complicated, you may want to try a walk in clinic. These are popping up all over the country in pharmacies, drugstores, and shopping centers. They're usually staffed by nurse practitioners or physician assistants that have been trained to treat common illnesses and complaints. They're usually fairly quick and inexpensive. However, if you have serious medical issues or chronic medical problems, these are probably not your best option. ° °No Primary Care Doctor: °- Call [facility] at  832-8000 - they can help you locate a primary care doctor that  accepts your insurance, provides certain services, etc. °- Physician Referral Service- 1-800-533-3463 ° °Chronic Pain Problems: °Organization         Address  Phone   Notes  °[facility]  (336) 297-2271 Patients need to be referred by their primary care doctor.  ° °Medication  Assistance: °Organization         Address  Phone   Notes  °[place] [facility] [address] °[place], [place] [address] (336) 641-8030 --Must be a resident of [place] °-- Must have NO insurance coverage whatsoever (no Medicaid/ Medicare, etc.) °-- The pt. MUST have a primary care doctor that directs their care regularly and follows them in the community °  °[facility]  (866) 331-1348   °[employer]  (888) 892-1162   ° °Agencies that provide inexpensive medical care: °Organization         Address  Phone   Notes  °[facility]  (336) 832-8035   °[facility]    (336) 832-7272   °[facility] [address] °[place], [place] [address] (336) 832-4777   °[facility] [address], °[place] (336) 271-4999   °[facility]    (336) 373-0678   °[facility]    (336) 272-1050   °[facility] ° [address], [place] Phone:  (336) 832-4444, Fax:  (336) 832-4440 Hours of Operation:  9 am - 6 pm, M-F.  Also accepts Medicaid/Medicare and self-pay.  °[facility] ° [address], [place] Phone: (336) 832-3150, Fax: (336) [phone]. Hours of Operation:  8:30 am - 5:30 pm, M-F.  Also accepts Medicaid and self-pay.  °[facility] [place] [address], [place] Phone: (336) 878-6027   °[facility] [address], [place], [place] (336)723-1848, Ext. 123 Mondays & Thursdays: 7-9 AM.  First 15 patients are seen on a first come, first serve basis. °  ° °Medicaid-accepting [place] Providers: ° °Organization         Address  Phone   Notes  °[facility] [address], [place] (336) 641-2100 Also accepts self-pay patients.  °[facility] [address], [place] ° (336) 856-9996   °[facility] [address], [place]  (336) 288-8857   °[facility] [address] [place] [address], [place] (336) 299-7000   °Veita Bland [address], [place]  ° (336) 373-1557 Only accepts [place] Access Medicaid patients after they have their name applied to their card.  ° °Self-Pay (no insurance) in [place]: ° °Organization         Address  Phone   Notes  °Sickle Cell Patients, [facility] [address], [place] (336) 832-1970   °[facility] [address], [place] (336) 832-4400   °[facility] ° [address], [place] (336) 992-4800   °[facility]/Dr. Osei-Bonsu ° [address] [place] [address], [place] or [address], [place] (336) 841-8500 Phone number for both [place] and [place] locations is the same.  °[facility] [address], [place] (336) 299-0000   °[facility] [address] [place] [address], [place] or [address] (336) 852-7530 °(336) 878-2260   °[facility] [address], [place] (336) 350-1642, phone; (336) 294-5005, fax Sees patients 1st and 3rd Saturday of every month.  Must not qualify for public or private insurance (i.e. Medicaid, Medicare, [place] Health Choice, Veterans' Benefits) • Household income should be no more than 200% of the poverty level •The clinic cannot treat you if you are pregnant or think you are pregnant • Sexually transmitted diseases are not treated at the clinic.  ° ° °Dental Care: °Organization         Address  Phone  Notes  °[place] [facility] [address], [place] (336) 641-6152 Accepts children up to age 21 who are enrolled in Medicaid or [place] Health Choice; pregnant women with a Medicaid card; and children who have applied for Medicaid or [place] Health Choice, but were declined, whose parents can pay a reduced fee at time of service.  °[place]  [facility] [place]  [address], [place] (336) 641-7733 Accepts children up to age 21 who are enrolled in Medicaid or [place] Health Choice; pregnant women with a Medicaid card; and children who have applied for Medicaid or [place] Health Choice, but were declined, whose parents can pay a reduced fee at time of service.  °[facility] ° [address], [place] (336) 641-4533 Patients are seen by appointment only. Walk-ins are not accepted. [facility] will see patients 18 years of age and older. °Monday - Tuesday (8am-5pm) °Most Wednesdays (8:30-5pm) °$30 per visit, cash only  °[facility] ° [address], [place] (336) 641-4533 Patients are seen by appointment only. Walk-ins are not accepted. [facility] will see patients 18 years of age and older. °One   Wednesday Evening (Monthly: Volunteer Based).  $30 per visit, cash only  °[facility]  (919) [phone] for adults; Children under age 4, call Graduate Pediatric Dentistry at ([phone]) [phone]. Children aged 4-14, please call ([phone]) [phone] to request a pediatric application. ° Dental services are provided in all areas of dental care including fillings, crowns and bridges, complete and partial dentures, implants, gum treatment, root canals, and extractions. Preventive care is also provided. Treatment is provided to both adults and children. °Patients are selected via a lottery and there is often a waiting list. °  °[facility] [address], °[place] ° (336) 763-8833 [email or web address] °  °[facility] [address], [place], [place] (336)723-1848, Ext. 123 Second and Fourth Thursday of each month, opens at 6:30 AM; Clinic ends at 9 AM.  Patients are seen on a first-come first-served basis, and a limited number are seen during each clinic.  ° °[facility] ° [address], [place], [place] (336) 723-7904    Eligibility Requirements °You must have lived in [place], [place], or [place] counties for at least the last three months. °  You cannot be eligible for state or federal sponsored healthcare insurance, including Veterans Administration, Medicaid, or Medicare. °  You generally cannot be eligible for healthcare insurance through your employer.  °  How to apply: °Eligibility screenings are held every Tuesday and Wednesday afternoon from 1:00 pm until 4:00 pm. You do not need an appointment for the interview!  °[facility] [address], [place], [place] 336-631-2330   °[facility]  336-342-8273   °[facility]  336-703-3100   °[facility]  336-570-6415   ° °Behavioral Health Resources in the Community: °Intensive Outpatient Programs °Organization         Address  Phone  Notes  °[facility] [address], [place], [place] 336-878-6098   °[facility] [address], [place], [place] 336-832-9800   °[facility] [address], [place], [place] ° 336-882-2125   °[facility] [address],  °[place], [place] 1-800-853-5163 or 336-641-4981   °Substance Abuse Resources °Organization         Address  Phone  Notes  °[facility]  336-882-2125   °[facility]  336-784-9470   °[facility]  336-285-9073   °[facility]  336-845-3988   °Residential & Outpatient Substance Abuse Program  1-800-659-3381   °Psychological Services °Organization         Address  Phone  Notes  °[facility]  336- 832-9600   °[facility]  336- 378-7881   °[facility] [address], [place] 1-800-853-5163 or 336-641-4981   ° °Mobile Crisis Teams °Organization         Address  Phone  Notes  °[facility], Mobile Crisis Care Unit  1-877-626-1772   °Assertive °[facility] ° [address]  [place], [place] 336-834-9664   °Sharon DeEsch [address] °[place] 336-554-5454   ° °Self-Help/Support Groups °Organization         Address  Phone             Notes  °[facility]  - variety of support groups  336- 373-1402 Call for more information  °Narcotics Anonymous (NA), [facility] [address], °[place]  2 meetings at this location  ° °  Residential Treatment Programs Organization         Address  Phone  Notes  [facility] [address],    [place]  [phone]   [facility]  [address], [place] [phone], [place], [place] [phone]   [facility] [address], [place] [phone] Admissions: 8am-3pm M-F  [facility] [address]    [place], [place] [phone]   [facility] [address], [place], [place] [phone]   [facility] [address]  [place], [place] [phone]   [facility] Intensive Outpatient [address], [place], [place] [phone]   [facility] ([facility] [address]  [place], [place] [phone] or [phone]   [facility] [address] [place], [place] [phone]([phone]) [phone] Accepts Medicaid  [facility] [address]  [place] [phone] Substance Abuse/Addiction Treatment   [facility]         Address  Phone  Notes  [facility]  718-639-8219(888) ([phone]   Angie FavaJulie Brannon, PhD [address], [address] [place], [place]   [phone](336) [phone] or (765)[phone](336) [phone]   [facility]   [address] [place], [place] ([phone](336) [phone]   [facility] [address], [place], [place] [phone](336) ([phone]) [phone] Insurance/Medicaid/sponsorship through [facility] [address]                                    [place], [place] [phone](336) ([phone] Therapy/tele-psych/case    [facility] [address]   [place], [place] [phone](336) [phone]    Dr. Lolly MustacheArfeen  [phone]) [phone]   [facility]  [employer] [facility] 1) [address], [place] 2) [address], [place] 3)  [address], [place] [phone](336) [phone](336) [phone]  [phone](336) [phone]   [facility] Child [facility] Hot[facility] (249) 780-8032(336) (404)798-7958 or [phone](336) [phone] (After Hours)      Eat a bland diet, avoiding greasy, fatty, fried foods, as well as spicy and acidic foods or beverages.  Avoid eating within the hour or 2 before going to bed or laying down.  Also avoid teas, colas, coffee, chocolate, pepermint and spearment.  Take over the counter pepcid, one tablet by mouth twice a day, for the next 2 to 3 weeks.  May also take over the counter maalox/mylanta, as directed on packaging, as needed for discomfort.  Take the prescription as directed. Eat a bland diet and advance to your regular diet slowly as you can tolerate it.   Avoid full strength juices, as well as milk and milk products until your diarrhea has resolved.   Call your regular medical doctor today to schedule a follow up appointment this week.  Return to the Emergency Department immediately if not improving (or even worsening) despite taking the medicines as prescribed, any black or bloody stool or vomit, if you develop a fever over "101," or for any other concerns.

## 2015-08-09 IMAGING — CT CT ABD-PELV W/ CM
2 of 4 series · 16 of 46 positions shown, 18 images · IV contrast (Omnipaque 300)
Comparison: CT abdomen and pelvis 12/31/2013.

CLINICAL DATA: Lower abdominal pain and diarrhea for 1 day. History
of diverticulitis and pancreatitis.

EXAM:
CT ABDOMEN AND PELVIS WITH CONTRAST
TECHNIQUE: Multidetector CT imaging of the abdomen and pelvis was performed
using the standard protocol following bolus administration of
intravenous contrast.
CONTRAST:  50 mL OMNIPAQUE IOHEXOL 300 MG/ML SOLN, 100 mL OMNIPAQUE
IOHEXOL 300 MG/ML SOLN

[Series 2: abd_pel_with 5.0 b40f · axial · 0.69mm/px · z∈[-520,-86]mm · 13 of 95 slices shown, 15 images]
[im 4/95  soft-tissue]
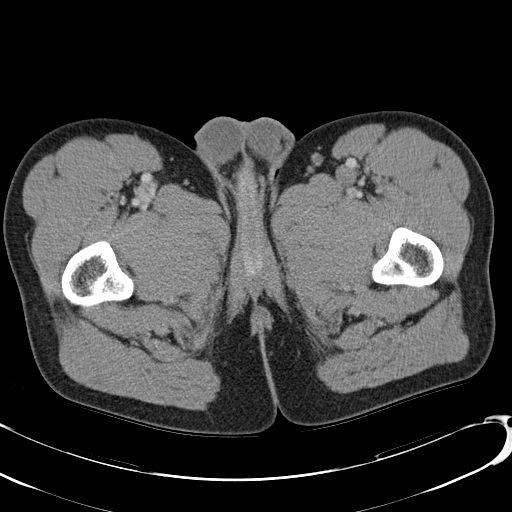
[im 4/95  bone]
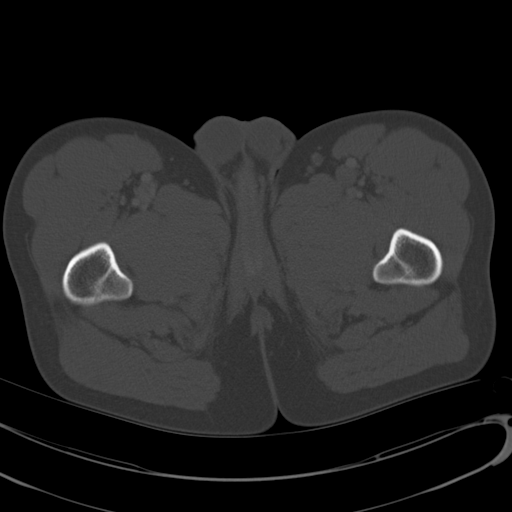
[im 12/95  soft-tissue]
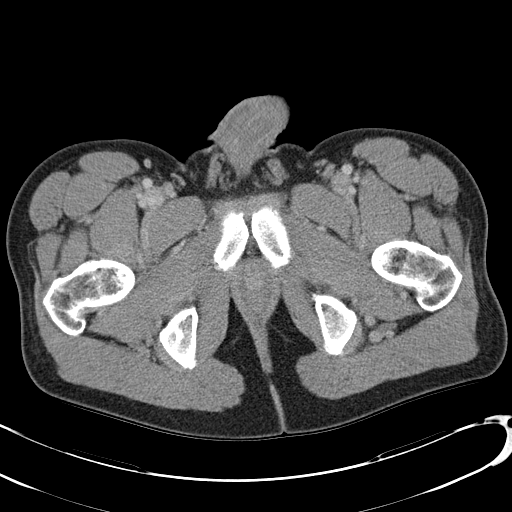
[im 19/95  soft-tissue]
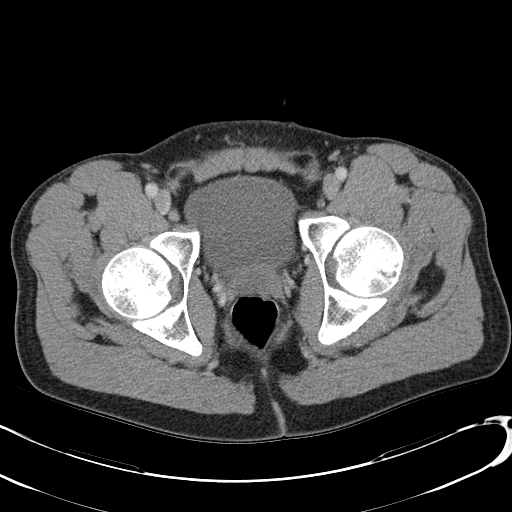
[im 27/95  soft-tissue]
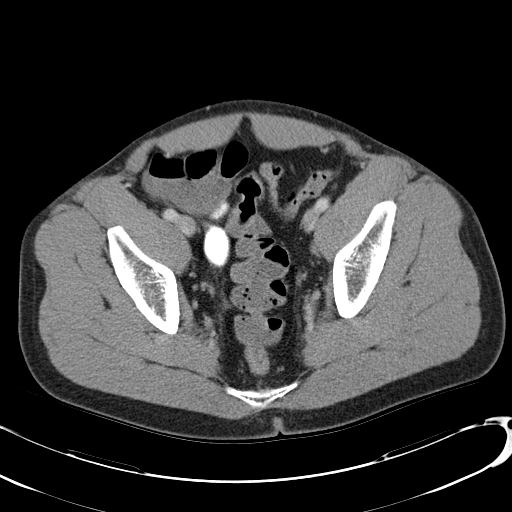
[im 34/95  soft-tissue]
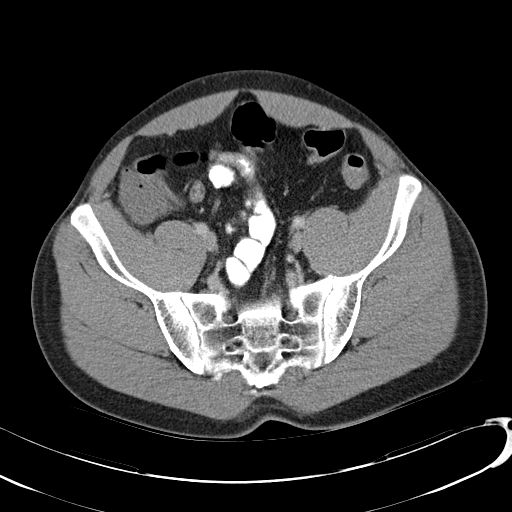
[im 42/95  soft-tissue]
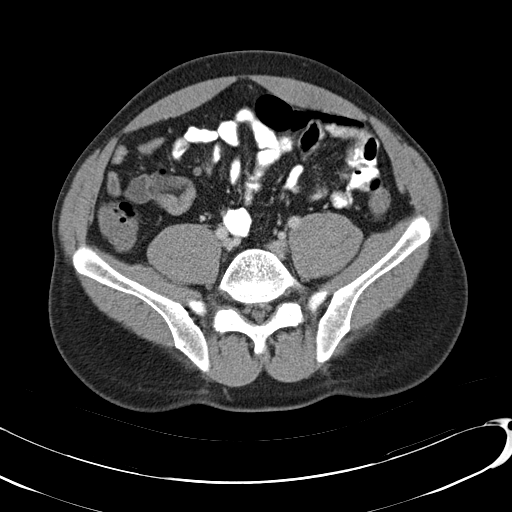
[im 49/95  soft-tissue]
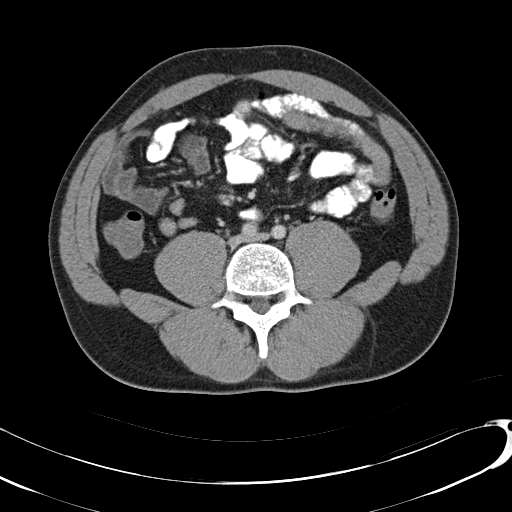
[im 53/95  soft-tissue]
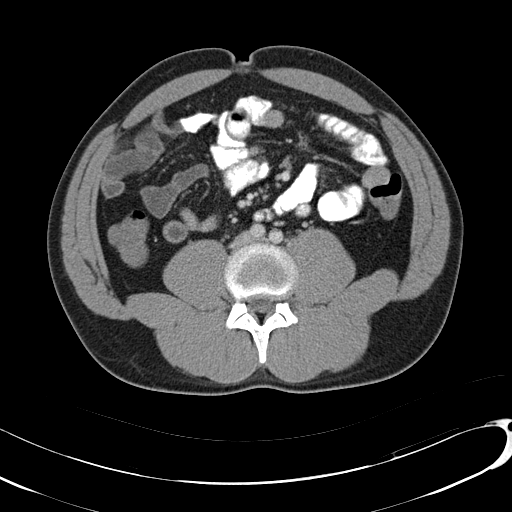
[im 61/95  soft-tissue]
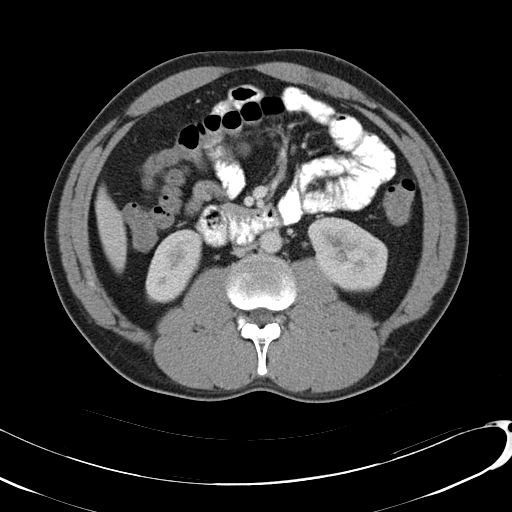
[im 61/95  bone]
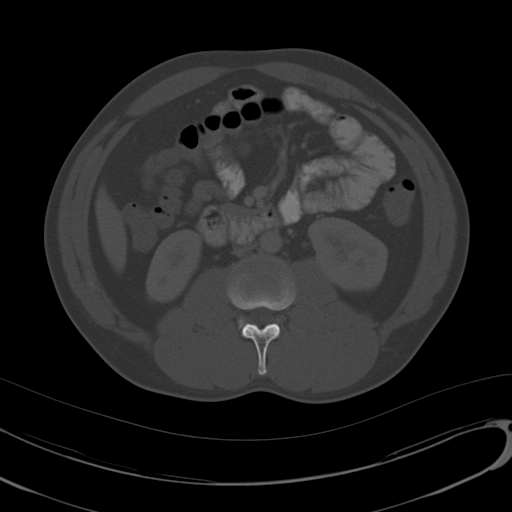
[im 68/95  soft-tissue]
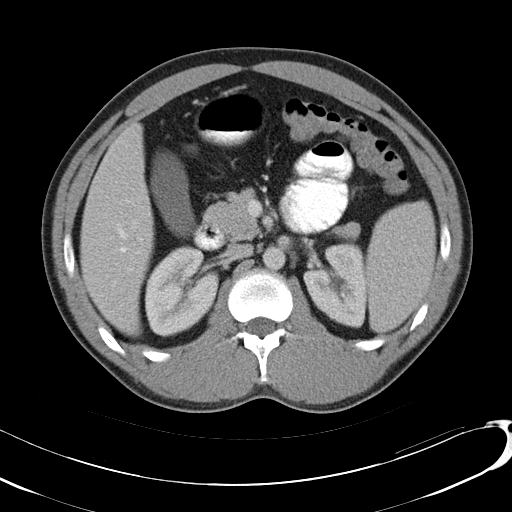
[im 76/95  soft-tissue]
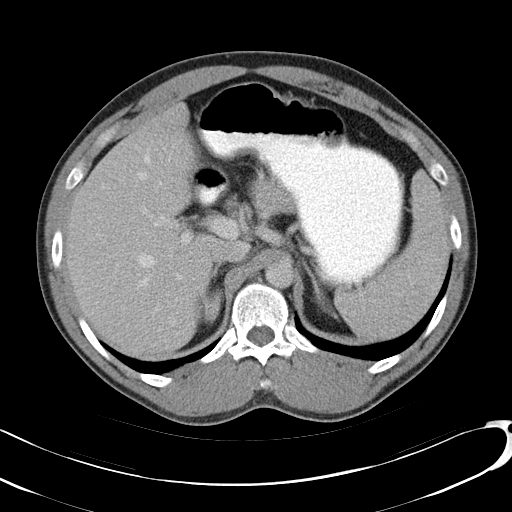
[im 83/95  soft-tissue]
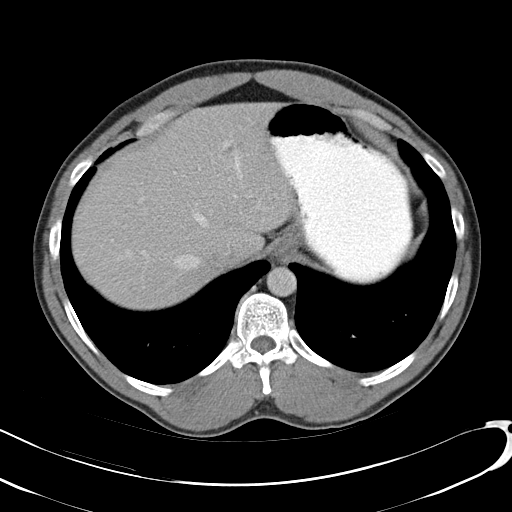
[im 91/95  soft-tissue]
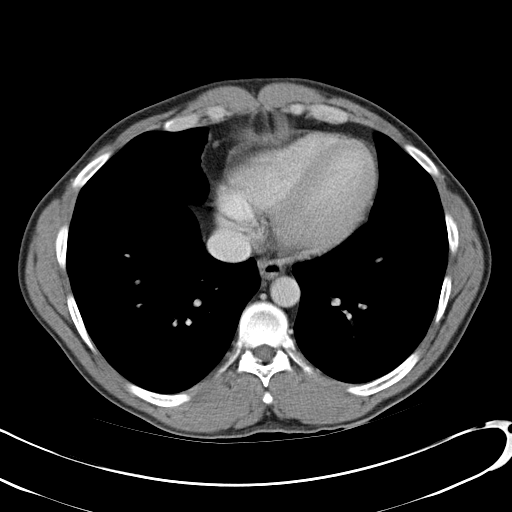

[Series 3: abd_pel_with 3.0 spo cor · coronal · 0.69mm/px · 3 of 80 slices shown]
[im 27/80  soft-tissue]
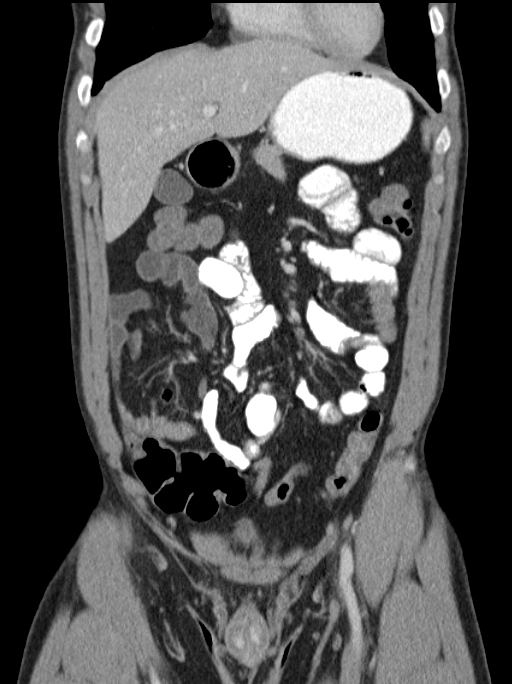
[im 36/80  soft-tissue]
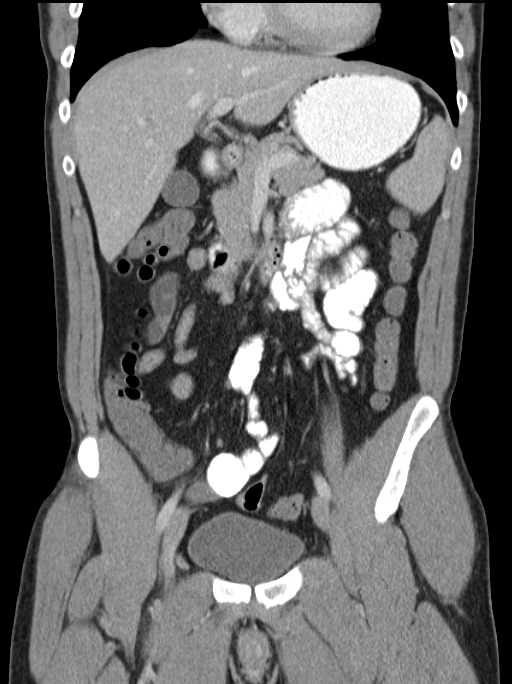
[im 44/80  soft-tissue]
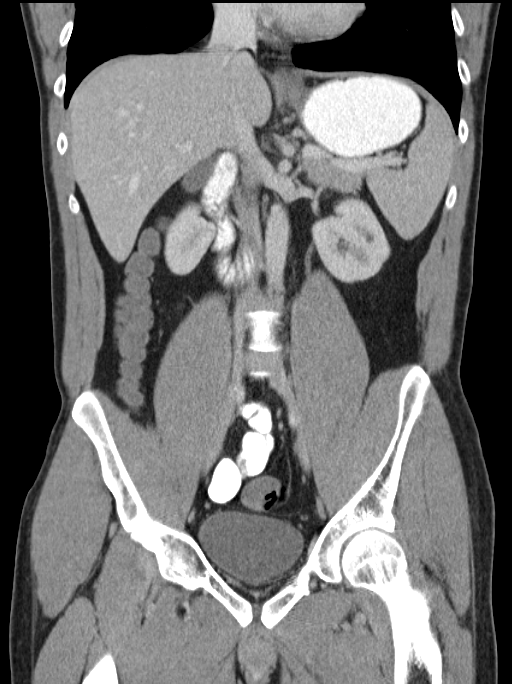

[16 of 46 positions shown; findings below may reference images not displayed]

FINDINGS: The lung bases are clear.  No pleural or pericardial effusion.

The stomach, small and large bowel and appendix all appear normal.
Small volume of free pelvic fluid seen on the comparison examination
has resolved. There is no lymphadenopathy.

The gallbladder, liver, spleen, adrenal glands, pancreas, biliary
tree and kidneys all appear normal. No bony abnormality is
identified.
IMPRESSION: Negative CT abdomen and pelvis.

## 2022-10-04 ENCOUNTER — Emergency Department: Payer: Medicaid Other

## 2022-10-04 ENCOUNTER — Inpatient Hospital Stay
Admission: EM | Admit: 2022-10-04 | Discharge: 2022-10-07 | DRG: 917 | Disposition: A | Discharge status: Home / Self Care | Payer: Medicaid Other | Attending: Internal Medicine | Admitting: Internal Medicine

## 2022-10-04 ENCOUNTER — Encounter: Payer: Self-pay | Admitting: Emergency Medicine

## 2022-10-04 ENCOUNTER — Other Ambulatory Visit: Payer: Self-pay

## 2022-10-04 DIAGNOSIS — M419 Scoliosis, unspecified: Secondary | ICD-10-CM | POA: Diagnosis present

## 2022-10-04 DIAGNOSIS — J9600 Acute respiratory failure, unspecified whether with hypoxia or hypercapnia: Secondary | ICD-10-CM | POA: Diagnosis not present

## 2022-10-04 DIAGNOSIS — F430 Acute stress reaction: Secondary | ICD-10-CM | POA: Diagnosis present

## 2022-10-04 DIAGNOSIS — Z79899 Other long term (current) drug therapy: Secondary | ICD-10-CM | POA: Diagnosis not present

## 2022-10-04 DIAGNOSIS — Z7951 Long term (current) use of inhaled steroids: Secondary | ICD-10-CM

## 2022-10-04 DIAGNOSIS — Z91128 Patient's intentional underdosing of medication regimen for other reason: Secondary | ICD-10-CM

## 2022-10-04 DIAGNOSIS — Y92009 Unspecified place in unspecified non-institutional (private) residence as the place of occurrence of the external cause: Secondary | ICD-10-CM

## 2022-10-04 DIAGNOSIS — T405X1A Poisoning by cocaine, accidental (unintentional), initial encounter: Principal | ICD-10-CM | POA: Diagnosis present

## 2022-10-04 DIAGNOSIS — J4552 Severe persistent asthma with status asthmaticus: Secondary | ICD-10-CM | POA: Diagnosis present

## 2022-10-04 DIAGNOSIS — Z5982 Transportation insecurity: Secondary | ICD-10-CM

## 2022-10-04 DIAGNOSIS — F32A Depression, unspecified: Secondary | ICD-10-CM | POA: Diagnosis present

## 2022-10-04 DIAGNOSIS — G4700 Insomnia, unspecified: Secondary | ICD-10-CM | POA: Diagnosis present

## 2022-10-04 DIAGNOSIS — J9601 Acute respiratory failure with hypoxia: Secondary | ICD-10-CM | POA: Diagnosis present

## 2022-10-04 DIAGNOSIS — Z87891 Personal history of nicotine dependence: Secondary | ICD-10-CM | POA: Diagnosis not present

## 2022-10-04 DIAGNOSIS — T405X1S Poisoning by cocaine, accidental (unintentional), sequela: Secondary | ICD-10-CM | POA: Diagnosis not present

## 2022-10-04 DIAGNOSIS — J9690 Respiratory failure, unspecified, unspecified whether with hypoxia or hypercapnia: Secondary | ICD-10-CM | POA: Diagnosis present

## 2022-10-04 DIAGNOSIS — N179 Acute kidney failure, unspecified: Secondary | ICD-10-CM | POA: Diagnosis present

## 2022-10-04 DIAGNOSIS — F1410 Cocaine abuse, uncomplicated: Secondary | ICD-10-CM | POA: Diagnosis present

## 2022-10-04 DIAGNOSIS — R739 Hyperglycemia, unspecified: Secondary | ICD-10-CM | POA: Diagnosis present

## 2022-10-04 DIAGNOSIS — Z9152 Personal history of nonsuicidal self-harm: Secondary | ICD-10-CM | POA: Diagnosis not present

## 2022-10-04 DIAGNOSIS — Z1152 Encounter for screening for COVID-19: Secondary | ICD-10-CM

## 2022-10-04 DIAGNOSIS — Z880 Allergy status to penicillin: Secondary | ICD-10-CM | POA: Diagnosis not present

## 2022-10-04 DIAGNOSIS — I10 Essential (primary) hypertension: Secondary | ICD-10-CM | POA: Diagnosis present

## 2022-10-04 DIAGNOSIS — F4321 Adjustment disorder with depressed mood: Secondary | ICD-10-CM | POA: Diagnosis present

## 2022-10-04 DIAGNOSIS — F419 Anxiety disorder, unspecified: Secondary | ICD-10-CM | POA: Diagnosis present

## 2022-10-04 DIAGNOSIS — J441 Chronic obstructive pulmonary disease with (acute) exacerbation: Secondary | ICD-10-CM | POA: Diagnosis present

## 2022-10-04 DIAGNOSIS — Z9151 Personal history of suicidal behavior: Secondary | ICD-10-CM | POA: Diagnosis not present

## 2022-10-04 DIAGNOSIS — F3289 Other specified depressive episodes: Secondary | ICD-10-CM | POA: Diagnosis not present

## 2022-10-04 DIAGNOSIS — Z91030 Bee allergy status: Secondary | ICD-10-CM

## 2022-10-04 DIAGNOSIS — T380X5A Adverse effect of glucocorticoids and synthetic analogues, initial encounter: Secondary | ICD-10-CM | POA: Diagnosis present

## 2022-10-04 DIAGNOSIS — J9602 Acute respiratory failure with hypercapnia: Secondary | ICD-10-CM | POA: Diagnosis present

## 2022-10-04 LAB — BASIC METABOLIC PANEL
Anion gap: 8 (ref 5–15)
BUN: 18 mg/dL (ref 6–20)
CO2: 28 mmol/L (ref 22–32)
Calcium: 9.3 mg/dL (ref 8.9–10.3)
Chloride: 105 mmol/L (ref 98–111)
Creatinine, Ser: 1.18 mg/dL (ref 0.61–1.24)
GFR, Estimated: >60 mL/min (ref >60)
Glucose, Bld: 103 mg/dL — ABNORMAL HIGH (ref 70–99)
Potassium: 4.4 mmol/L (ref 3.5–5.1)
Sodium: 141 mmol/L (ref 135–145)

## 2022-10-04 LAB — BLOOD GAS, ARTERIAL
Acid-base deficit: 3.2 mmol/L — ABNORMAL HIGH (ref 0.0–2.0)
Bicarbonate: 26 mmol/L (ref 20.0–28.0)
FIO2: 0.6 %
MECHVT: 420 mL
O2 Saturation: 100 %
PEEP: 5 cmH2O
Patient temperature: 35.2
RATE: 12 {breaths}/min
pCO2 arterial: 60 mmHg — ABNORMAL HIGH (ref 32–48)
pH, Arterial: 7.23 — ABNORMAL LOW (ref 7.35–7.45)
pO2, Arterial: 259 mmHg — ABNORMAL HIGH (ref 83–108)

## 2022-10-04 LAB — CBC
HCT: 44.6 % (ref 39.0–52.0)
Hemoglobin: 14.6 g/dL (ref 13.0–17.0)
MCH: 28.9 pg (ref 26.0–34.0)
MCHC: 32.7 g/dL (ref 30.0–36.0)
MCV: 88.1 fL (ref 80.0–100.0)
Platelets: 294 10*3/uL (ref 150–400)
RBC: 5.06 MIL/uL (ref 4.22–5.81)
RDW: 12.9 % (ref 11.5–15.5)
WBC: 8.7 10*3/uL (ref 4.0–10.5)
nRBC: 0 % (ref 0.0–0.2)

## 2022-10-04 LAB — CBC WITH DIFFERENTIAL/PLATELET
Abs Immature Granulocytes: 0.02 10*3/uL (ref 0.00–0.07)
Basophils Absolute: 0 10*3/uL (ref 0.0–0.1)
Basophils Relative: 1 %
Eosinophils Absolute: 0.5 10*3/uL (ref 0.0–0.5)
Eosinophils Relative: 8 %
HCT: 43.7 % (ref 39.0–52.0)
Hemoglobin: 14.4 g/dL (ref 13.0–17.0)
Immature Granulocytes: 0 %
Lymphocytes Relative: 15 %
Lymphs Abs: 1 10*3/uL (ref 0.7–4.0)
MCH: 28.6 pg (ref 26.0–34.0)
MCHC: 33 g/dL (ref 30.0–36.0)
MCV: 86.9 fL (ref 80.0–100.0)
Monocytes Absolute: 0.6 10*3/uL (ref 0.1–1.0)
Monocytes Relative: 10 %
Neutro Abs: 4.2 10*3/uL (ref 1.7–7.7)
Neutrophils Relative %: 66 %
Platelets: 256 10*3/uL (ref 150–400)
RBC: 5.03 MIL/uL (ref 4.22–5.81)
RDW: 12.6 % (ref 11.5–15.5)
WBC: 6.4 10*3/uL (ref 4.0–10.5)
nRBC: 0 % (ref 0.0–0.2)

## 2022-10-04 LAB — RESP PANEL BY RT-PCR (FLU A&B, COVID) ARPGX2
Influenza A by PCR: NEGATIVE
Influenza B by PCR: NEGATIVE
SARS Coronavirus 2 by RT PCR: NEGATIVE

## 2022-10-04 LAB — GLUCOSE, CAPILLARY
Glucose-Capillary: 186 mg/dL — ABNORMAL HIGH (ref 70–99)
Glucose-Capillary: 136 mg/dL — ABNORMAL HIGH (ref 70–99)

## 2022-10-04 LAB — TROPONIN I (HIGH SENSITIVITY)
Troponin I (High Sensitivity): 8 ng/L (ref <18)
Troponin I (High Sensitivity): 8 ng/L (ref <18)

## 2022-10-04 LAB — URINE DRUG SCREEN, QUALITATIVE (ARMC ONLY)
Amphetamines, Ur Screen: NOT DETECTED
Barbiturates, Ur Screen: NOT DETECTED
Benzodiazepine, Ur Scrn: NOT DETECTED
Cannabinoid 50 Ng, Ur ~~LOC~~: POSITIVE — AB
Cocaine Metabolite,Ur ~~LOC~~: POSITIVE — AB
MDMA (Ecstasy)Ur Screen: NOT DETECTED
Methadone Scn, Ur: NOT DETECTED
Opiate, Ur Screen: NOT DETECTED
Phencyclidine (PCP) Ur S: NOT DETECTED
Tricyclic, Ur Screen: NOT DETECTED

## 2022-10-04 LAB — CREATININE, SERUM
Creatinine, Ser: 0.98 mg/dL (ref 0.61–1.24)
GFR, Estimated: >60 mL/min (ref >60)

## 2022-10-04 LAB — MRSA NEXT GEN BY PCR, NASAL: MRSA by PCR Next Gen: NOT DETECTED

## 2022-10-04 LAB — STREP PNEUMONIAE URINARY ANTIGEN: Strep Pneumo Urinary Antigen: NEGATIVE

## 2022-10-04 MED ORDER — BUDESONIDE 0.5 MG/2ML IN SUSP
0.5000 mg | Freq: Two times a day (BID) | RESPIRATORY_TRACT | Status: DC
  Administered 2022-10-04 – 2022-10-07 (×6): 0.5 mg via RESPIRATORY_TRACT
  Filled 2022-10-04 (×6): qty 2

## 2022-10-04 MED ORDER — HYDRALAZINE HCL 20 MG/ML IJ SOLN: 20.0000 mg | INTRAMUSCULAR | Status: DC | PRN

## 2022-10-04 MED ORDER — POLYETHYLENE GLYCOL 3350 17 G PO PACK: 17.0000 g | PACK | Freq: Every day | ORAL | Status: DC | PRN

## 2022-10-04 MED ORDER — SODIUM CHLORIDE 0.9 % IV SOLN: 250.0000 mL | INTRAVENOUS | Status: DC

## 2022-10-04 MED ORDER — ORAL CARE MOUTH RINSE: 15.0000 mL | OROMUCOSAL | Status: DC | PRN

## 2022-10-04 MED ORDER — IPRATROPIUM-ALBUTEROL 0.5-2.5 (3) MG/3ML IN SOLN
3.0000 mL | Freq: Once | RESPIRATORY_TRACT | Status: AC
  Administered 2022-10-04: 3 mL via RESPIRATORY_TRACT
  Filled 2022-10-04: qty 3

## 2022-10-04 MED ORDER — METHYLPREDNISOLONE SODIUM SUCC 125 MG IJ SOLR
125.0000 mg | INTRAMUSCULAR | Status: AC
  Administered 2022-10-04: 125 mg via INTRAVENOUS
  Filled 2022-10-04: qty 2

## 2022-10-04 MED ORDER — MAGNESIUM SULFATE 2 GM/50ML IV SOLN
2.0000 g | Freq: Once | INTRAVENOUS | Status: AC
  Administered 2022-10-04: 2 g via INTRAVENOUS
  Filled 2022-10-04: qty 50

## 2022-10-04 MED ORDER — POLYETHYLENE GLYCOL 3350 17 G PO PACK: 17.0000 g | PACK | Freq: Every day | ORAL | Status: DC

## 2022-10-04 MED ORDER — ORAL CARE MOUTH RINSE
15.0000 mL | OROMUCOSAL | Status: DC
  Administered 2022-10-04 – 2022-10-05 (×9): 15 mL via OROMUCOSAL

## 2022-10-04 MED ORDER — PROPOFOL 1000 MG/100ML IV EMUL: 5.0000 ug/kg/min | INTRAVENOUS | Status: DC

## 2022-10-04 MED ORDER — FAMOTIDINE 20 MG PO TABS: 20.0000 mg | ORAL_TABLET | Freq: Two times a day (BID) | ORAL | Status: DC

## 2022-10-04 MED ORDER — FENTANYL 2500MCG IN NS 250ML (10MCG/ML) PREMIX INFUSION
0.0000 ug/h | INTRAVENOUS | Status: DC
  Administered 2022-10-04: 150 ug/h via INTRAVENOUS
  Filled 2022-10-04: qty 250

## 2022-10-04 MED ORDER — ROCURONIUM BROMIDE 10 MG/ML (PF) SYRINGE
PREFILLED_SYRINGE | INTRAVENOUS | Status: AC
  Filled 2022-10-04: qty 20

## 2022-10-04 MED ORDER — SODIUM CHLORIDE 0.9 % IV SOLN: 250.0000 mL | INTRAVENOUS | Status: DC | PRN

## 2022-10-04 MED ORDER — DOCUSATE SODIUM 50 MG/5ML PO LIQD
100.0000 mg | Freq: Two times a day (BID) | ORAL | Status: DC
  Administered 2022-10-04: 100 mg
  Filled 2022-10-04: qty 10

## 2022-10-04 MED ORDER — LIDOCAINE-EPINEPHRINE 1 %-1:100000 IJ SOLN: 20.0000 mL | Freq: Once | INTRAMUSCULAR | Status: DC

## 2022-10-04 MED ORDER — CHLORHEXIDINE GLUCONATE CLOTH 2 % EX PADS
6.0000 | MEDICATED_PAD | Freq: Every day | CUTANEOUS | Status: DC
  Administered 2022-10-04 – 2022-10-06 (×3): 6 via TOPICAL

## 2022-10-04 MED ORDER — KETAMINE HCL 50 MG/5ML IJ SOSY: 2.0000 mg/kg | PREFILLED_SYRINGE | Freq: Once | INTRAMUSCULAR | Status: DC

## 2022-10-04 MED ORDER — KETAMINE HCL 50 MG/5ML IJ SOSY
PREFILLED_SYRINGE | INTRAMUSCULAR | Status: AC
  Administered 2022-10-04: 136 mg via INTRAVENOUS
  Filled 2022-10-04: qty 10

## 2022-10-04 MED ORDER — SODIUM CHLORIDE 0.9 % IV SOLN
500.0000 mg | INTRAVENOUS | Status: DC
  Administered 2022-10-04: 500 mg via INTRAVENOUS
  Filled 2022-10-04: qty 5

## 2022-10-04 MED ORDER — HEPARIN SODIUM (PORCINE) 5000 UNIT/ML IJ SOLN
5000.0000 [IU] | Freq: Three times a day (TID) | INTRAMUSCULAR | Status: DC
  Administered 2022-10-04 – 2022-10-05 (×2): 5000 [IU] via SUBCUTANEOUS
  Filled 2022-10-04 (×2): qty 1

## 2022-10-04 MED ORDER — ONDANSETRON HCL 4 MG/2ML IJ SOLN
4.0000 mg | Freq: Four times a day (QID) | INTRAMUSCULAR | Status: DC | PRN
  Administered 2022-10-06: 4 mg via INTRAVENOUS
  Filled 2022-10-04: qty 2

## 2022-10-04 MED ORDER — IPRATROPIUM-ALBUTEROL 0.5-2.5 (3) MG/3ML IN SOLN
3.0000 mL | RESPIRATORY_TRACT | Status: DC
  Administered 2022-10-04 – 2022-10-06 (×13): 3 mL via RESPIRATORY_TRACT
  Filled 2022-10-04 (×14): qty 3

## 2022-10-04 MED ORDER — ACETAMINOPHEN 325 MG PO TABS
650.0000 mg | ORAL_TABLET | ORAL | Status: DC | PRN
  Administered 2022-10-05: 650 mg via ORAL
  Filled 2022-10-04: qty 2

## 2022-10-04 MED ORDER — MIDAZOLAM HCL 2 MG/2ML IJ SOLN
4.0000 mg | INTRAMUSCULAR | Status: DC | PRN
  Administered 2022-10-04: 4 mg via INTRAVENOUS
  Administered 2022-10-04: 6 mg via INTRAVENOUS
  Administered 2022-10-04: 4 mg via INTRAVENOUS
  Administered 2022-10-04: 6 mg via INTRAVENOUS
  Administered 2022-10-05: 4 mg via INTRAVENOUS
  Filled 2022-10-04: qty 6
  Filled 2022-10-04 (×3): qty 4
  Filled 2022-10-04: qty 6

## 2022-10-04 MED ORDER — NOREPINEPHRINE 4 MG/250ML-% IV SOLN
INTRAVENOUS | Status: AC
  Administered 2022-10-04: 4 ug/min via INTRAVENOUS
  Filled 2022-10-04: qty 250

## 2022-10-04 MED ORDER — PROPOFOL 1000 MG/100ML IV EMUL
INTRAVENOUS | Status: AC
  Administered 2022-10-04: 15 ug/kg/min via INTRAVENOUS
  Filled 2022-10-04: qty 100

## 2022-10-04 MED ORDER — SODIUM CHLORIDE 0.9% FLUSH: 3.0000 mL | INTRAVENOUS | Status: DC | PRN

## 2022-10-04 MED ORDER — NOREPINEPHRINE 4 MG/250ML-% IV SOLN
2.0000 ug/min | INTRAVENOUS | Status: DC
  Administered 2022-10-05: 6 ug/min via INTRAVENOUS
  Filled 2022-10-04: qty 250

## 2022-10-04 MED ORDER — LISINOPRIL 10 MG PO TABS: 10.0000 mg | ORAL_TABLET | Freq: Once | ORAL | Status: DC

## 2022-10-04 MED ORDER — FAMOTIDINE IN NACL 20-0.9 MG/50ML-% IV SOLN
20.0000 mg | Freq: Two times a day (BID) | INTRAVENOUS | Status: DC
  Administered 2022-10-04 – 2022-10-05 (×2): 20 mg via INTRAVENOUS
  Filled 2022-10-04 (×2): qty 50

## 2022-10-04 MED ORDER — ROCURONIUM BROMIDE 10 MG/ML (PF) SYRINGE
80.0000 mg | PREFILLED_SYRINGE | Freq: Once | INTRAVENOUS | Status: AC
  Administered 2022-10-04: 80 mg via INTRAVENOUS

## 2022-10-04 MED ORDER — SODIUM CHLORIDE 0.9% FLUSH
3.0000 mL | Freq: Two times a day (BID) | INTRAVENOUS | Status: DC
  Administered 2022-10-04 – 2022-10-06 (×5): 3 mL via INTRAVENOUS

## 2022-10-04 MED ORDER — ALBUTEROL SULFATE (2.5 MG/3ML) 0.083% IN NEBU
INHALATION_SOLUTION | RESPIRATORY_TRACT | Status: AC
  Administered 2022-10-04: 2.5 mg
  Filled 2022-10-04: qty 30

## 2022-10-04 MED ORDER — LACTATED RINGERS IV BOLUS
500.0000 mL | Freq: Once | INTRAVENOUS | Status: AC
  Administered 2022-10-04: 500 mL via INTRAVENOUS

## 2022-10-04 MED ORDER — AMLODIPINE BESYLATE 5 MG PO TABS
5.0000 mg | ORAL_TABLET | Freq: Once | ORAL | Status: DC
  Filled 2022-10-04: qty 1

## 2022-10-04 MED ORDER — MIDAZOLAM-SODIUM CHLORIDE 100-0.9 MG/100ML-% IV SOLN
0.5000 mg/h | INTRAVENOUS | Status: DC
  Administered 2022-10-04: 4 mg/h via INTRAVENOUS
  Filled 2022-10-04: qty 100

## 2022-10-04 MED ORDER — DOCUSATE SODIUM 100 MG PO CAPS
100.0000 mg | ORAL_CAPSULE | Freq: Two times a day (BID) | ORAL | Status: DC | PRN
  Administered 2022-10-07: 100 mg via ORAL
  Filled 2022-10-04: qty 1

## 2022-10-04 MED ORDER — VECURONIUM BROMIDE 10 MG IV SOLR: 10.0000 mg | INTRAVENOUS | Status: DC | PRN

## 2022-10-04 MED ORDER — METHYLPREDNISOLONE SODIUM SUCC 40 MG IJ SOLR
40.0000 mg | Freq: Two times a day (BID) | INTRAMUSCULAR | Status: DC
  Administered 2022-10-04 – 2022-10-05 (×2): 40 mg via INTRAVENOUS
  Filled 2022-10-04 (×2): qty 1

## 2022-10-04 NOTE — ED Notes (Signed)
Roconium 80mg  given

## 2022-10-04 NOTE — Progress Notes (Signed)
An USGPIV (ultrasound guided PIV) has been placed for short-term vasopressor infusion. A correctly placed ivWatch must be used when administering Vasopressors. Should this treatment be needed beyond 24 hours, central line access should be obtained.  It will be the responsibility of the bedside nurse to follow best practice to prevent extravasations.

## 2022-10-04 NOTE — H&P (Signed)
NAME:  Patrick Black, MRN:  [id number], DOB:  1980-02-26, LOS: 0 ADMISSION DATE:  10/04/2022  CHIEF COMPLAINT:  severe ASTHMA attack   History of Present Illness:  42 y.o. male reports he has a history of asthma.   He ran out of albuterol and he reports he is having increasing shortness of breath for about 3 days   He reports he has had a slight cough.  No fevers or chills.  No chest pain.   He reports he is quite short of breath.  He also has a history of high blood pressure but is run out of all of his medication.  Is not taking any medication or albuterol at this time   Denies history of recent long trip or travel, no leg swelling, no history of blood clots.  He used to smoke.   He has been wheezing PATIENT HAD PROGRESSIVE WOB AND WAS INTUBATED AND PLACED ON VENT   Pertinent  Medical History   Active Ambulatory Problems    Diagnosis Date Noted   Abdominal pain 12/31/2013   Enterocolitis 12/31/2013   Dehydration 12/31/2013   Resolved Ambulatory Problems    Diagnosis Date Noted   No Resolved Ambulatory Problems   Past Medical History:  Diagnosis Date   Acute renal failure (HCC)    Anxiety    Asthma    Chronic back pain    Chronic lumbar radiculopathy    Hypertension    Migraine headache    Pain management    Pancreatitis    Renal disorder      Significant Hospital Events: Including procedures, antibiotic start and stop dates in addition to other pertinent events   9/8 ADMIT TO ICU FOR SEVERE ASTHMA EXACERBATION       Antimicrobials:   Antibiotics Given (last 72 hours)     None                Objective   Blood pressure (!) 154/123, pulse (!) 135, temperature 98.3 F (36.8 C), temperature source Oral, resp. rate (!) 23, height 5\' 11"  (1.803 m), weight 68 kg, SpO2 90%.       No intake or output data in the 24 hours ending 10/04/22 1506 Filed Weights   10/04/22 1151  Weight: 68 kg     REVIEW OF SYSTEMS  PATIENT IS UNABLE TO PROVIDE  COMPLETE REVIEW OF SYSTEMS DUE TO SEVERE CRITICAL ILLNESS   PHYSICAL EXAMINATION:  GENERAL:critically ill appearing, +resp distress EYES: Pupils equal, round, reactive to light.  No scleral icterus.  MOUTH: Moist mucosal membrane. INTUBATED NECK: Supple.  PULMONARY: Lungs clear to auscultation, +rhonchi, +wheezing CARDIOVASCULAR: S1 and S2.  Regular rate and rhythm GASTROINTESTINAL: Soft, nontender, -distended. Positive bowel sounds.  MUSCULOSKELETAL: No swelling, clubbing, or edema.  NEUROLOGIC: obtunded,sedated SKIN:normal, warm to touch, Capillary refill delayed  Pulses present bilaterally   Labs/imaging that I havepersonally reviewed  (right click and "Reselect all SmartList Selections" daily)      ASSESSMENT AND PLAN SYNOPSIS  Severe ACUTE Hypoxic and Hypercapnic Respiratory Failure DUE TO SEVERE ASTHMA EXACERBATION DUE TO SEVERE COCAINE POISONING AND DRUG ABUSE -continue Mechanical Ventilator support -continue Bronchodilator Therapy -Wean Fio2 and PEEP as tolerated -VAP/VENT bundle implementation    SEVERE ASTHMA EXACERBATION -continue IV steroids as prescribed -continue NEB THERAPY as prescribed -morphine as needed -wean fio2 as needed and tolerated   CARDIAC ICU monitoring   RENAL -continue Foley Catheter-assess need -Avoid nephrotoxic agents -Follow urine output, BMP -Ensure adequate renal perfusion, optimize oxygenation -Renal  dose medications  No intake or output data in the 24 hours ending 10/04/22 1506   NEUROLOGY need for sedation Goal RASS -2 to -3   INFECTIOUS DISEASE -continue antibiotics as prescribed -follow up cultures   ENDO - ICU hypoglycemic\Hyperglycemia protocol -check FSBS per protocol   GI GI PROPHYLAXIS as indicated  NUTRITIONAL STATUS DIET-->TF's as tolerated Constipation protocol as indicated   ELECTROLYTES -follow labs as needed -replace as needed -pharmacy consultation and following   ACUTE  ANEMIA- TRANSFUSE AS NEEDED      Best practice (right click and "Reselect all SmartList Selections" daily)  Diet: NPO Pain/Anxiety/Delirium protocol (if indicated): Yes (RASS goal -3) VAP protocol (if indicated): Yes DVT prophylaxis: Subcutaneous Heparin GI prophylaxis: H2B Code Status:  FULL Disposition:ICU  Labs   CBC: Recent Labs  Lab 10/04/22 1159  WBC 6.4  NEUTROABS 4.2  HGB 14.4  HCT 43.7  MCV 86.9  PLT 256    Basic Metabolic Panel: Recent Labs  Lab 10/04/22 1159  NA 141  K 4.4  CL 105  CO2 28  GLUCOSE 103*  BUN 18  CREATININE 1.18  CALCIUM 9.3   GFR: Estimated Creatinine Clearance: 78.4 mL/min (by C-G formula based on SCr of 1.18 mg/dL). Recent Labs  Lab 10/04/22 1159  WBC 6.4    Liver Function Tests: No results for input(s): "AST", "ALT", "ALKPHOS", "BILITOT", "PROT", "ALBUMIN" in the last 168 hours. No results for input(s): "LIPASE", "AMYLASE" in the last 168 hours. No results for input(s): "AMMONIA" in the last 168 hours.  ABG No results found for: "PHART", "PCO2ART", "PO2ART", "HCO3", "TCO2", "ACIDBASEDEF", "O2SAT"   Coagulation Profile: No results for input(s): "INR", "PROTIME" in the last 168 hours.  Cardiac Enzymes: No results for input(s): "CKTOTAL", "CKMB", "CKMBINDEX", "TROPONINI" in the last 168 hours.  HbA1C: No results found for: "HGBA1C"  CBG: No results for input(s): "GLUCAP" in the last 168 hours.   Past Medical History:  He,  has a past medical history of Acute renal failure (HCC), Anxiety, Asthma, Chronic back pain, Chronic lumbar radiculopathy, Hypertension, Migraine headache, Pain management, Pancreatitis, and Renal disorder.   Surgical History:  History reviewed. No pertinent surgical history.   Social History:   reports that he has never smoked. He does not have any smokeless tobacco history on file. He reports that he does not drink alcohol and does not use drugs.   Family History:  His family history is  not on file.   Allergies Allergies  Allergen Reactions   Adhesive [Tape] Dermatitis   Bee Venom    Penicillins     Hives      Home Medications  Prior to Admission medications   Medication Sig Start Date End Date Taking? Authorizing Provider  albuterol (PROVENTIL) (2.5 MG/3ML) 0.083% nebulizer solution Inhale 2.5 mg into the lungs every 4 (four) hours as needed for wheezing or shortness of breath.  02/03/13   [provider]  albuterol (VENTOLIN HFA) 108 (90 BASE) MCG/ACT inhaler Inhale 2 puffs into the lungs every 6 (six) hours as needed for wheezing or shortness of breath.  10/23/13   [provider]  busPIRone (BUSPAR) 10 MG tablet Take 10 mg by mouth 3 (three) times daily.    [provider]  citalopram (CELEXA) 40 MG tablet Take 40 mg by mouth at bedtime.    [provider]  Fluticasone-Salmeterol (ADVAIR) 500-50 MCG/DOSE AEPB Inhale 1 puff into the lungs at bedtime.    [provider]  metoprolol tartrate (LOPRESSOR) 25  MG tablet Take 25 mg by mouth 2 (two) times daily.    [provider]  montelukast (SINGULAIR) 10 MG tablet Take 10 mg by mouth at bedtime. 10/23/13   [provider]  QUEtiapine (SEROQUEL) 300 MG tablet Take 300 mg by mouth at bedtime.    [provider]  tiZANidine (ZANAFLEX) 4 MG tablet Take 4 mg by mouth every 8 (eight) hours as needed for muscle spasms.    [provider]  verapamil (CALAN) 80 MG tablet Take 80 mg by mouth at bedtime.     [provider]         Critical Care Time devoted to patient care services described in this note is 65 minutes.   Critical care was necessary to treat /prevent imminent and life-threatening deterioration.   PATIENT WITH VERY POOR PROGNOSIS I ANTICIPATE PROLONGED ICU LOS  Patient is critically ill. Patient with Multiorgan failure and at high risk for cardiac arrest and death.    Lucie Leather, M.D.  [facility]  Medical Director [facility] Medical Director [facility] Cardio-Pulmonary Department

## 2022-10-04 NOTE — ED Triage Notes (Signed)
Patient reports ongoing Asthma flare over the last 3 days, using inhaler at home w/out any relief.  88-90% on RA; place on 2L nasal cannula at this time.

## 2022-10-04 NOTE — ED Provider Notes (Signed)
DG Chest Portable 1 View  Result Date: 10/04/2022 CLINICAL DATA:  Acute respiratory failure. Endotracheally intubated orogastric tube placement. EXAM: PORTABLE CHEST 1 VIEW COMPARISON:  10/04/2022 FINDINGS: Endotracheal tube and nasogastric tube are in appropriate position. The heart size and mediastinal contours are within normal limits. Both lungs are clear. The visualized skeletal structures are unremarkable. IMPRESSION: No active disease. Electronically Signed   By: Danae Orleans M.D.   On: 10/04/2022 15:22    Sharyn Creamer, MD 10/04/22 1553

## 2022-10-04 NOTE — ED Notes (Signed)
Intubated 22 at the teeth

## 2022-10-04 NOTE — ED Notes (Signed)
Ketamine 100mg  given

## 2022-10-04 NOTE — ED Provider Notes (Signed)
[facility] Provider Note    Event Date/Time   First MD Initiated Contact with Patient 10/04/22 1318     (approximate)   History   Asthma   HPI  Patrick Black is a 42 y.o. male reports he has a history of asthma.  He ran out of albuterol and he reports he is having increasing shortness of breath for about 3 days  He reports he has had a slight cough.  No fevers or chills.  No chest pain.  He reports he is quite short of breath.  He also has a history of high blood pressure but is run out of all of his medication.  Is not taking any medication or albuterol at this time  Denies history of recent long trip or travel, no leg swelling, no history of blood clots.  He used to smoke.  He has been wheezing     Physical Exam   Triage Vital Signs: ED Triage Vitals  Encounter Vitals Group     BP 10/04/22 1150 (!) 171/100     Systolic BP Percentile --      Diastolic BP Percentile --      Pulse Rate 10/04/22 1150 100     Resp 10/04/22 1150 (!) 22     Temp 10/04/22 1150 98.3 F (36.8 C)     Temp Source 10/04/22 1150 Oral     SpO2 10/04/22 1149 90 %     Weight 10/04/22 1151 150 lb (68 kg)     Height 10/04/22 1151 5\' 11"  (1.803 m)     Head Circumference --      Peak Flow --      Pain Score 10/04/22 1151 9     Pain Loc --      Pain Education --      Exclude from Growth Chart --     Most recent vital signs: Vitals:   10/04/22 1150 10/04/22 1445  BP: (!) 171/100 (!) 154/123  Pulse: 100 (!) 135  Resp: (!) 22 (!) 23  Temp: 98.3 F (36.8 C)   SpO2: 90%      General: Awake, with moderately increased work of breathing.  Speaks in phrases.  Currently on 4 L nasal cannula. CV:  Good peripheral perfusion.  Normal tones and rate Resp:  Moderate work of breathing.  Mild accessory muscle use.  Diffuse expiratory wheezing.  Speaks in phrases.  No evidence of acute fatigue, no tripoding. Abd:  No distention.  Other:  No lower extremity edema  bilaterally   ED Results / Procedures / Treatments   Labs (all labs ordered are listed, but only abnormal results are displayed) Labs Reviewed  BASIC METABOLIC PANEL - Abnormal; Notable for the following components:      Result Value   Glucose, Bld 103 (*)    All other components within normal limits  RESP PANEL BY RT-PCR (FLU A&B, COVID) ARPGX2  CBC WITH DIFFERENTIAL/PLATELET  HIV ANTIBODY (ROUTINE TESTING W REFLEX)  CBC  CREATININE, SERUM  LEGIONELLA PNEUMOPHILA SEROGP 1 UR AG  STREP PNEUMONIAE URINARY ANTIGEN  URINE DRUG SCREEN, QUALITATIVE ([facility] ONLY)  TROPONIN I (HIGH SENSITIVITY)  TROPONIN I (HIGH SENSITIVITY)     EKG  And interpreted by me at noon heart rate 95 QRS 80 QTc 470 Normal sinus rhythm, findings concerning for LVH with repolarization abnormality   RADIOLOGY  Initial chest x-ray interpreted by me as negative for acute finding  DG Chest 2 View  Result Date: 10/04/2022 CLINICAL  DATA:  42 year old male with shortness of breath. Asthma exacerbation. EXAM: CHEST - 2 VIEW COMPARISON:  Chest radiographs 02/06/2014 and earlier. FINDINGS: PA and lateral views at 1211 hours. Larger lung volumes. Normal cardiac size and mediastinal contours. Visualized tracheal air column is within normal limits. Both lungs appear clear. No pneumothorax or pleural effusion. Negative visible bowel gas and osseous structures. IMPRESSION: Hyperinflation.  No other acute cardiopulmonary abnormality. Electronically Signed   By: Odessa Fleming M.D.   On: 10/04/2022 12:20      PROCEDURES:  Critical Care performed: Yes, see critical care procedure note(s)  CRITICAL CARE Performed by: Sharyn Creamer   Total critical care time: 50 minutes  Critical care time was exclusive of separately billable procedures and treating other patients.  Critical care was necessary to treat or prevent imminent or life-threatening deterioration.  Critical care was time spent personally by me on the following  activities: development of treatment plan with patient and/or surrogate as well as nursing, discussions with consultants, evaluation of patient's response to treatment, examination of patient, obtaining history from patient or surrogate, ordering and performing treatments and interventions, ordering and review of laboratory studies, ordering and review of radiographic studies, pulse oximetry and re-evaluation of patient's condition.   Procedure Name: Intubation Date/Time: 10/04/2022 2:52 PM  Performed by: Sharyn Creamer, MDPre-anesthesia Checklist: Patient identified, Patient being monitored, Emergency Drugs available, Timeout performed and Suction available Oxygen Delivery Method: Non-rebreather mask Preoxygenation: Pre-oxygenation with 100% oxygen Induction Type: Rapid sequence Laryngoscope Size: Glidescope and 3 Grade View: Grade I Tube type: Subglottic suction tube Tube size: 7.5 mm Number of attempts: 1 Airway Equipment and Method: Video-laryngoscopy Placement Confirmation: ETT inserted through vocal cords under direct vision, CO2 detector and Breath sounds checked- equal and bilateral Secured at: 21 cm Tube secured with: Tape Dental Injury: Teeth and Oropharynx as per pre-operative assessment  Comments: Brief (30 second) desaturation to 70% immediately after intubation atttempt which was rapid and not difficult, rapidly improved to 100% FI02.        MEDICATIONS ORDERED IN ED: Medications  magnesium sulfate IVPB 2 g 50 mL (2 g Intravenous New Bag/Given 10/04/22 1423)  ketamine 50 mg in normal saline 5 mL (10 mg/mL) syringe (has no administration in time range)  rocuronium bromide 10 mg/mL (PF) syringe (has no administration in time range)  rocuronium bromide 100 MG/10ML SOSY (has no administration in time range)  ketamine HCl 50 MG/5ML SOSY (has no administration in time range)  propofol (DIPRIVAN) 1000 MG/100ML infusion (20 mcg/kg/min  68 kg Intravenous Rate/Dose Change 10/04/22 1455)   sodium chloride flush (NS) 0.9 % injection 3 mL (has no administration in time range)  sodium chloride flush (NS) 0.9 % injection 3 mL (has no administration in time range)  0.9 %  sodium chloride infusion (has no administration in time range)  acetaminophen (TYLENOL) tablet 650 mg (has no administration in time range)  docusate sodium (COLACE) capsule 100 mg (has no administration in time range)  polyethylene glycol (MIRALAX / GLYCOLAX) packet 17 g (has no administration in time range)  ondansetron (ZOFRAN) injection 4 mg (has no administration in time range)  heparin injection 5,000 Units (has no administration in time range)  famotidine (PEPCID) IVPB 20 mg premix (has no administration in time range)  docusate (COLACE) 50 MG/5ML liquid 100 mg (has no administration in time range)  polyethylene glycol (MIRALAX / GLYCOLAX) packet 17 g (has no administration in time range)  midazolam (VERSED) injection 4-6 mg (has no administration in  time range)  vecuronium (NORCURON) injection 10 mg (has no administration in time range)  ipratropium-albuterol (DUONEB) 0.5-2.5 (3) MG/3ML nebulizer solution 3 mL (3 mLs Nebulization Given 10/04/22 1159)  ipratropium-albuterol (DUONEB) 0.5-2.5 (3) MG/3ML nebulizer solution 3 mL (3 mLs Nebulization Given 10/04/22 1354)  ipratropium-albuterol (DUONEB) 0.5-2.5 (3) MG/3ML nebulizer solution 3 mL (3 mLs Nebulization Given 10/04/22 1354)  ipratropium-albuterol (DUONEB) 0.5-2.5 (3) MG/3ML nebulizer solution 3 mL (3 mLs Nebulization Given 10/04/22 1352)  methylPREDNISolone sodium succinate (SOLU-MEDROL) 125 mg/2 mL injection 125 mg (125 mg Intravenous Given 10/04/22 1423)  albuterol (PROVENTIL) (2.5 MG/3ML) 0.083% nebulizer solution (2.5 mg  Given 10/04/22 1459)     IMPRESSION / MDM / ASSESSMENT AND PLAN / ED COURSE  I reviewed the triage vital signs and the nursing notes.                              Differential diagnosis includes, but is not limited to, asthma  exacerbation, COPD exacerbation, pneumonia, pneumothorax, cardiac, CHF etc.  Based on the patient's history and presentation as well as running out of medications cough and wheezing I am most suspicious for asthma exacerbation.  He shows moderate increased work of breathing.  Will initiate magnesium, steroids, and multiple nebulizers and observe closely.  The patient does at this time have a oxygen requirement and presented with initial hypoxia.  He does not have any identifiable risk factor for acute thromboembolism versus based on PERC score from initial evaluation.  Patient's presentation is most consistent with acute presentation with potential threat to life or bodily function.   The patient is on the cardiac monitor to evaluate for evidence of arrhythmia and/or significant heart rate changes.  Labs interpreted normal CBC, normal troponin, normal metabolic panel.  Patient does not have any acute chest pain.  Suspect underlying LVH.  See my additional note, the patient on reevaluation after completing his nebulizer treatments had worsening respiratory failure ultimately requiring intubation for respiratory failure with suspected status asthmaticus.      FINAL CLINICAL IMPRESSION(S) / ED DIAGNOSES   Final diagnoses:  Severe persistent asthma with status asthmaticus  Acute respiratory failure, unspecified whether with hypoxia or hypercapnia (HCC)     Rx / DC Orders   ED Discharge Orders     None        Note:  This document was prepared using Dragon voice recognition software and may include unintentional dictation errors.   Sharyn Creamer, MD 10/04/22 [phone]

## 2022-10-04 NOTE — ED Notes (Signed)
Advised nurse that patient has ready bed 

## 2022-10-04 NOTE — Progress Notes (Signed)
PHARMACY CONSULT NOTE  Pharmacy Consult for Electrolyte Monitoring and Replacement   Recent Labs: Potassium (mmol/L)  Date Value  10/04/2022 4.4   Calcium (mg/dL)  Date Value  96/29/5284 9.3   Albumin (g/dL)  Date Value  13/24/4010 4.3   Sodium (mmol/L)  Date Value  10/04/2022 141     Assessment: Patient received mag sulfate 2 g IV x 1 dose Electrolytes WNL  Goal of Therapy:  Electrolytes WNL  Plan:  No additional electrolyte supplementation needed. Check BMP, Mag, and Phos with AM labs  Merryl Hacker ,PharmD Clinical Pharmacist 10/04/2022 3:10 PM

## 2022-10-04 NOTE — ED Provider Notes (Signed)
Patient noted to have rapidly worsening respiratory status.  On reevaluation the patient had completed his DuoNebs magnesium and Solu-Medrol and he is noted to now be leaning over the bed, try 5 putting forward, diaphoretic and his respiratory status is worsened.  He has clear evidence of respiratory distress and worsening exacerbation despite previous treatment  Given the patient's degree of distress, concern that the patient will require emergent intubation.  Moved to main section of ER, intubated without difficulty.  Brief desaturation following an approximately 10-second attempt at laryngoscopy which was successful with tube passing through the vocal cords, I suspect the patient had almost no inspiratory reserve at this point.  Chest x-ray reviewed, pending orogastric insertion.  Endotracheal tube advanced 2 cm.  Awaiting repeat chest x-ray and also OG tube placement  Patient tolerating vent well at this time.  Propofol ordered.  Consulted with and patient accepted to ICU by Dr. Belia Heman at 2:45 PM    Sharyn Creamer, MD 10/04/22 [phone]

## 2022-10-04 NOTE — ED Provider Triage Note (Signed)
Emergency Medicine Provider Triage Evaluation Note  Patrick Black , a 42 y.o. male  was evaluated in triage.  Pt complains of asthma for 3 days. Also has chest pain. Feels worse than normal asthma exacerbations. Also has COPD. Has been intubated for asthma before, 4 times. No history of PE/DVT  Review of Systems  Positive: SOB, CP Negative: Vomiting, diaphoresis  Physical Exam  BP (!) 171/100 (BP Location: Right Arm)   Pulse 100   Temp 98.3 F (36.8 C) (Oral)   Resp (!) 22   Ht 5\' 11"  (1.803 m)   Wt 68 kg   SpO2 90%   BMI 20.92 kg/m  Gen:   Awake, no distress   Resp:  Normal effort. On Oxygen in triage. Able to speak in complete sentences MSK:   Moves extremities without difficulty  Other:  No lower extremity edema  Medical Decision Making  Medically screening exam initiated at 11:52 AM.  Appropriate orders placed.  Patrick Black was informed that the remainder of the evaluation will be completed by another provider, this initial triage assessment does not replace that evaluation, and the importance of remaining in the ED until their evaluation is complete.     Jackelyn Hoehn, PA-C 10/04/22 1155

## 2022-10-05 DIAGNOSIS — F430 Acute stress reaction: Secondary | ICD-10-CM | POA: Diagnosis not present

## 2022-10-05 DIAGNOSIS — J9600 Acute respiratory failure, unspecified whether with hypoxia or hypercapnia: Secondary | ICD-10-CM | POA: Diagnosis not present

## 2022-10-05 DIAGNOSIS — F4321 Adjustment disorder with depressed mood: Secondary | ICD-10-CM | POA: Insufficient documentation

## 2022-10-05 DIAGNOSIS — J4552 Severe persistent asthma with status asthmaticus: Secondary | ICD-10-CM | POA: Diagnosis not present

## 2022-10-05 LAB — PHOSPHORUS: Phosphorus: 3.7 mg/dL (ref 2.5–4.6)

## 2022-10-05 LAB — BASIC METABOLIC PANEL
Anion gap: 8 (ref 5–15)
BUN: 25 mg/dL — ABNORMAL HIGH (ref 6–20)
CO2: 24 mmol/L (ref 22–32)
Calcium: 8.5 mg/dL — ABNORMAL LOW (ref 8.9–10.3)
Chloride: 105 mmol/L (ref 98–111)
Creatinine, Ser: 2.05 mg/dL — ABNORMAL HIGH (ref 0.61–1.24)
GFR, Estimated: 41 mL/min — ABNORMAL LOW (ref >60)
Glucose, Bld: 191 mg/dL — ABNORMAL HIGH (ref 70–99)
Potassium: 5.1 mmol/L (ref 3.5–5.1)
Sodium: 137 mmol/L (ref 135–145)

## 2022-10-05 LAB — GLUCOSE, CAPILLARY
Glucose-Capillary: 129 mg/dL — ABNORMAL HIGH (ref 70–99)
Glucose-Capillary: 134 mg/dL — ABNORMAL HIGH (ref 70–99)
Glucose-Capillary: 165 mg/dL — ABNORMAL HIGH (ref 70–99)
Glucose-Capillary: 169 mg/dL — ABNORMAL HIGH (ref 70–99)

## 2022-10-05 LAB — CBC
HCT: 38.4 % — ABNORMAL LOW (ref 39.0–52.0)
Hemoglobin: 12.6 g/dL — ABNORMAL LOW (ref 13.0–17.0)
MCH: 28.5 pg (ref 26.0–34.0)
MCHC: 32.8 g/dL (ref 30.0–36.0)
MCV: 86.9 fL (ref 80.0–100.0)
Platelets: 276 10*3/uL (ref 150–400)
RBC: 4.42 MIL/uL (ref 4.22–5.81)
RDW: 13 % (ref 11.5–15.5)
WBC: 10.3 10*3/uL (ref 4.0–10.5)
nRBC: 0 % (ref 0.0–0.2)

## 2022-10-05 LAB — HIV ANTIBODY (ROUTINE TESTING W REFLEX): HIV Screen 4th Generation wRfx: NONREACTIVE

## 2022-10-05 LAB — MAGNESIUM: Magnesium: 2.4 mg/dL (ref 1.7–2.4)

## 2022-10-05 MED ORDER — LAMOTRIGINE 25 MG PO TABS
100.0000 mg | ORAL_TABLET | Freq: Two times a day (BID) | ORAL | Status: DC
  Administered 2022-10-05 – 2022-10-07 (×4): 100 mg via ORAL
  Filled 2022-10-05 (×4): qty 4

## 2022-10-05 MED ORDER — DULOXETINE HCL 30 MG PO CPEP
60.0000 mg | ORAL_CAPSULE | Freq: Every day | ORAL | Status: DC
  Administered 2022-10-05 – 2022-10-07 (×3): 60 mg via ORAL
  Filled 2022-10-05 (×3): qty 2

## 2022-10-05 MED ORDER — OLANZAPINE 5 MG PO TABS
5.0000 mg | ORAL_TABLET | Freq: Every day | ORAL | Status: DC
  Administered 2022-10-05 – 2022-10-06 (×2): 5 mg via ORAL
  Filled 2022-10-05 (×2): qty 1

## 2022-10-05 MED ORDER — ENOXAPARIN SODIUM 40 MG/0.4ML IJ SOSY
40.0000 mg | PREFILLED_SYRINGE | Freq: Every day | INTRAMUSCULAR | Status: DC
  Administered 2022-10-05 – 2022-10-06 (×2): 40 mg via SUBCUTANEOUS
  Filled 2022-10-05 (×2): qty 0.4

## 2022-10-05 MED ORDER — ENSURE ENLIVE PO LIQD
237.0000 mL | Freq: Three times a day (TID) | ORAL | Status: DC
  Administered 2022-10-05 – 2022-10-07 (×7): 237 mL via ORAL

## 2022-10-05 MED ORDER — HYDROXYZINE HCL 50 MG PO TABS
50.0000 mg | ORAL_TABLET | Freq: Three times a day (TID) | ORAL | Status: DC | PRN
  Administered 2022-10-05: 50 mg via ORAL
  Filled 2022-10-05: qty 2

## 2022-10-05 MED ORDER — ADULT MULTIVITAMIN W/MINERALS CH
1.0000 | ORAL_TABLET | Freq: Every day | ORAL | Status: DC
  Administered 2022-10-06 – 2022-10-07 (×2): 1 via ORAL
  Filled 2022-10-05 (×2): qty 1

## 2022-10-05 MED ORDER — AMLODIPINE BESYLATE 10 MG PO TABS
10.0000 mg | ORAL_TABLET | Freq: Every day | ORAL | Status: DC
  Administered 2022-10-05 – 2022-10-07 (×3): 10 mg via ORAL
  Filled 2022-10-05 (×3): qty 1

## 2022-10-05 NOTE — Progress Notes (Signed)
NAME:  Patrick Black, MRN:  [id number], DOB:  1980-09-03, LOS: 1 ADMISSION DATE:  10/04/2022  CHIEF COMPLAINT:  severe ASTHMA attack   History of Present Illness:  42 y.o. male reports he has a history of asthma.   He ran out of albuterol and he reports he is having increasing shortness of breath for about 3 days   He reports he has had a slight cough.  No fevers or chills.  No chest pain.   He reports he is quite short of breath.  He also has a history of high blood pressure but is run out of all of his medication.  Is not taking any medication or albuterol at this time   Denies history of recent long trip or travel, no leg swelling, no history of blood clots.  He used to smoke.   He has been wheezing PATIENT HAD PROGRESSIVE WOB AND WAS INTUBATED AND PLACED ON VENT   Pertinent  Medical History   Active Ambulatory Problems    Diagnosis Date Noted   Abdominal pain 12/31/2013   Enterocolitis 12/31/2013   Dehydration 12/31/2013   Resolved Ambulatory Problems    Diagnosis Date Noted   No Resolved Ambulatory Problems   Past Medical History:  Diagnosis Date   Acute renal failure (HCC)    Anxiety    Asthma    Chronic back pain    Chronic lumbar radiculopathy    Hypertension    Migraine headache    Pain management    Pancreatitis    Renal disorder      Significant Hospital Events: Including procedures, antibiotic start and stop dates in addition to other pertinent events   9/8 ADMIT TO ICU FOR SEVERE ASTHMA EXACERBATION 9/9 plan for sat/sbt and trial of extubation       Antimicrobials:   Antibiotics Given (last 72 hours)     Date/Time Action Medication Dose Rate   10/04/22 1618 New Bag/Given   azithromycin (ZITHROMAX) 500 mg in sodium chloride 0.9 % 250 mL IVPB 500 mg 250 mL/hr                Objective   Blood pressure 107/64, pulse 75, temperature 99 F (37.2 C), resp. rate 15, height 5\' 11"  (1.803 m), weight 61.2 kg, SpO2 100%.    Vent Mode:  PRVC FiO2 (%):  [30 %-60 %] 30 % Set Rate:  [12 bmp] 12 bmp Vt Set:  [420 mL-600 mL] 600 mL PEEP:  [5 cmH20] 5 cmH20 Plateau Pressure:  [15 cmH20-22 cmH20] 22 cmH20   Intake/Output Summary (Last 24 hours) at 10/05/2022 2536 Last data filed at 10/05/2022 6440 Gross per 24 hour  Intake 693.87 ml  Output 450 ml  Net 243.87 ml   Filed Weights   10/04/22 1151 10/04/22 1533 10/05/22 0409  Weight: 68 kg 61.2 kg 61.2 kg      REVIEW OF SYSTEMS  PATIENT IS UNABLE TO PROVIDE COMPLETE REVIEW OF SYSTEMS DUE TO SEVERE CRITICAL ILLNESS   PHYSICAL EXAMINATION:  GENERAL:critically ill appearing, +resp distress EYES: Pupils equal, round, reactive to light.  No scleral icterus.  MOUTH: Moist mucosal membrane. INTUBATED NECK: Supple.  PULMONARY: Lungs clear to auscultation, +rhonchi, +wheezing CARDIOVASCULAR: S1 and S2.  Regular rate and rhythm GASTROINTESTINAL: Soft, nontender, -distended. Positive bowel sounds.  MUSCULOSKELETAL: No swelling, clubbing, or edema.  NEUROLOGIC: obtunded,sedated SKIN:normal, warm to touch, Capillary refill delayed  Pulses present bilaterally    Labs/imaging that I havepersonally reviewed  (right click and "Reselect all SmartList  Selections" daily)      ASSESSMENT AND PLAN SYNOPSIS  Severe ACUTE Hypoxic and Hypercapnic Respiratory Failure DUE TO SEVERE ASTHMA EXACERBATION DUE TO SEVERE COCAINE POISONING AND DRUG ABUSE Severe ACUTE Hypoxic and Hypercapnic Respiratory Failure -continue Mechanical Ventilator support -Wean Fio2 and PEEP as tolerated -VAP/VENT bundle implementation - Wean PEEP & FiO2 as tolerated, maintain SpO2 > 88% - Head of bed elevated 30 degrees, VAP protocol in place - Plateau pressures less than 30 cm H20  - Intermittent chest x-ray & ABG PRN - Ensure adequate pulmonary hygiene  -will perform SAT/SBT when respiratory parameters are met  Vent Mode: PRVC FiO2 (%):  [30 %-60 %] 30 % Set Rate:  [12 bmp] 12 bmp Vt Set:  [420 mL-600  mL] 600 mL PEEP:  [5 cmH20] 5 cmH20 Plateau Pressure:  [15 cmH20-22 cmH20] 22 cmH20  SEVERE ASTHMA EXACERBATION -continue IV steroids as prescribed -continue NEB THERAPY as prescribed -morphine as needed -wean fio2 as needed and tolerated   CARDIAC ICU monitoring    NEUROLOGY WUA pending   INFECTIOUS DISEASE -continue antibiotics as prescribed -follow up cultures   ENDO - ICU hypoglycemic\Hyperglycemia protocol -check FSBS per protocol   GI GI PROPHYLAXIS as indicated  NUTRITIONAL STATUS DIET-->NPO for extubation Constipation protocol as indicated   ELECTROLYTES -follow labs as needed -replace as needed -pharmacy consultation and following      Best practice (right click and "Reselect all SmartList Selections" daily)  Diet: NPO Pain/Anxiety/Delirium protocol (if indicated): Yes (RASS goal -3) VAP protocol (if indicated): Yes DVT prophylaxis: Subcutaneous Heparin GI prophylaxis: H2B Code Status:  FULL Disposition:ICU  Labs   CBC: Recent Labs  Lab 10/04/22 1159 10/04/22 1454 10/05/22 0259  WBC 6.4 8.7 10.3  NEUTROABS 4.2  --   --   HGB 14.4 14.6 12.6*  HCT 43.7 44.6 38.4*  MCV 86.9 88.1 86.9  PLT 256 294 276    Basic Metabolic Panel: Recent Labs  Lab 10/04/22 1159 10/04/22 1454 10/05/22 0259  NA 141  --  137  K 4.4  --  5.1  CL 105  --  105  CO2 28  --  24  GLUCOSE 103*  --  191*  BUN 18  --  25*  CREATININE 1.18 0.98 2.05*  CALCIUM 9.3  --  8.5*  MG  --   --  2.4  PHOS  --   --  3.7   GFR: Estimated Creatinine Clearance: 40.6 mL/min (A) (by C-G formula based on SCr of 2.05 mg/dL (H)). Recent Labs  Lab 10/04/22 1159 10/04/22 1454 10/05/22 0259  WBC 6.4 8.7 10.3    Liver Function Tests: No results for input(s): "AST", "ALT", "ALKPHOS", "BILITOT", "PROT", "ALBUMIN" in the last 168 hours. No results for input(s): "LIPASE", "AMYLASE" in the last 168 hours. No results for input(s): "AMMONIA" in the last 168 hours.  ABG     Component Value Date/Time   PHART 7.23 (L) 10/04/2022 1546   PCO2ART 60 (H) 10/04/2022 1546   PO2ART 259 (H) 10/04/2022 1546   HCO3 26.0 10/04/2022 1546   ACIDBASEDEF 3.2 (H) 10/04/2022 1546   O2SAT 100 10/04/2022 1546      DVT/GI PRX  assessed I Assessed the need for Labs I Assessed the need for Foley I Assessed the need for Central Venous Line Family Discussion when available I Assessed the need for Mobilization I made an Assessment of medications to be adjusted accordingly Safety Risk assessment completed  CASE DISCUSSED IN MULTIDISCIPLINARY ROUNDS WITH ICU TEAM  Critical Care Time devoted to patient care services described in this note is 55 minutes.  Critical care was necessary to treat /prevent imminent and life-threatening deterioration.    Lucie Leather, M.D.  [facility]  Medical Director [facility] Medical Director [facility] Cardio-Pulmonary Department

## 2022-10-05 NOTE — TOC Initial Note (Addendum)
Transition of Care [facility]) - Initial/Assessment Note    Patient Details  Name: Patrick Black MRN: [id number] Date of Birth: [birth year]  Transition of Care [facility]) CM/SW Contact:    Kreg Shropshire, RN Phone Number: 10/05/2022, 11:10 AM  Clinical Narrative:                 Cm received consult for Home Health/DME needs. Pt stated he is originally from [place], [place]. He is now living with his cousin in [place]. Pt stated that he was living in [place], [place] with his sister but decided to move to [facility] to be closer to his son. He is working on changing his Medicaid to [facility]. Pt wanted resources in [facility] with housing and mental health. Cm will add information to AVS  Cm added housing information to AVS. Cm also gave pt handout for [employer]  Pt has PCP but has not seen in a long time. Cm will also add PCP list to AVS and give pt [facility] Application.  Pt has transportation from cousin when he is medically ready for d/c.  Readmission prevention screening completed.   Cm will continue for toc needs.     Barriers to Discharge: Continued Medical Work up   Patient Goals and CMS Choice   CMS Medicare.gov Compare Post Acute Care list provided to:: Patient   [place] ownership interest in [facility].provided to:: Patient    Expected Discharge Plan and Services                                              Prior Living Arrangements/Services              Need for Family Participation in Patient Care: Yes (Comment) Care giver support system in place?: Yes (comment)      Activities of Daily Living      Permission Sought/Granted                  Emotional Assessment Appearance:: Appears stated age Attitude/Demeanor/Rapport: Engaged Affect (typically observed): Calm Orientation: : Oriented to Self, Oriented to Place, Oriented to  Time, Oriented to Situation      Admission  diagnosis:  Respiratory failure (HCC) [J96.90] Severe persistent asthma with status asthmaticus [J45.52] Acute respiratory failure, unspecified whether with hypoxia or hypercapnia (HCC) [J96.00] Patient Active Problem List   Diagnosis Date Noted   Respiratory failure (HCC) 10/04/2022   Abdominal pain 12/31/2013   Enterocolitis 12/31/2013   Dehydration 12/31/2013   PCP:  Remigio Eisenmenger, MD Pharmacy:   [facility] [phone] - [place], [place] - [address] AT [address] [place] [address] Phone: [phone] Fax: [phone]  [id number] - [place], [place] - [address] AT [facility] [address] [facility] [address] [place] [address] Phone: [phone] Fax: [phone]     Social Determinants of Health (SDOH) Social History: SDOH Screenings   Food Insecurity: Patient Unable To Answer (10/04/2022)  Housing: Patient Unable To Answer (10/04/2022)  Transportation Needs: Patient Unable To Answer (10/04/2022)  Utilities: Patient Unable To Answer (10/04/2022)  Tobacco Use: Unknown (10/04/2022)   SDOH Interventions:     Readmission Risk Interventions     No data to display

## 2022-10-05 NOTE — Consult Note (Signed)
[facility] Face-to-Face Psychiatry Consult   Reason for Consult:  "psych meds" Referring Physician:  Erin Fulling, MD Patient Identification: Patrick Black MRN:  [id number] Principal Diagnosis: Respiratory failure [facility]) Diagnosis:  Principal Problem:   Respiratory failure (HCC) Active Problems:   Grief   Acute stress reaction   Total Time spent with patient: 45 minutes  Subjective:   Patrick Black is a 42 y.o. male patient with a history of asthma, depression, and anxiety presented to the emergency department on 9/8 with increased shortness of breath, requiring emergent intubation due to worsening respiratory status. He is being seen at the request of the medical team for medication management.    HPI: Patient seen face to face by this provider, consulted with attending physician; and chart reviewed on 10/05/22.  On evaluation, he is alert and oriented x4, anxious, albeit cooperative. No indication of preoccupation, distractibility, or response to external stimuli. Mood euthymic, flat affect, speech normal in rate, volume, and tone. Patrick Black reports his girlfriend overdosed on July 23rd on prescription medication. He states he found her in her son's room. He states her parents then kicked him out. He denies active suicidal or homicidal ideations, paranoia or delusional thought content. He reports past command auditory hallucinations more than five years ago, which he reports have not occurred since starting medications. Appetite is reported good. Sleep is satisfactory with occasional disturbances.   Patrick Black reports a history of depression and anxiety diagnosed as a teenager. Reports trauma from father's death at age 65 and mother's death in 2020-10-25. Mr. Lebaron endorsed self-injurious behaviors in teenage years, cutting hand with a razor blade. Attempted overdose on Celexa in 2015/10/26 due to stress from ex-girlfriend cheating. He denies family history of suicide or mental health diagnoses. He reports a  previous inpatient due to auditory hallucinations. He denies outpatient counseling services due to transportation issues. Patrick Black states he has been off medication since July 23rd as ex-girlfriend's family did not allow him to remove belongings. He states previously prescribed Seroquel (discontinued due to dizziness) and Celexa (discontinued due to suicidal ideation). Currently on Cymbalta, Lamictal, and olanzapine. Medical diagnoses: asthma, hypertension, and scoliosis.   Patrick Black states he is from [place], currently living in [place]. Identifies sister as support system. He denies access to firearms and legal issues. He states he receives disability benefits for mental health. He reports occasional social alcohol consumption, last reported this past weekend (one cup of liquor). He states he uses THC edibles, occasionally, last used this past weekend. UDS on 9/8 was positive for cocaine and marijuana. PDMP Review revealed no active prescription.   Past Psychiatric History: Anxiety, depression, substance abuse   Risk to Self:   Risk to Others:   Prior Inpatient Therapy:   Prior Outpatient Therapy:    Past Medical History:  Past Medical History:  Diagnosis Date   Acute renal failure (HCC)    Anxiety    Asthma    Chronic back pain    Chronic lumbar radiculopathy    Hypertension    Migraine headache    Pain management    Pancreatitis    Renal disorder    History reviewed. No pertinent surgical history. Family History: History reviewed. No pertinent family history. Family Psychiatric  History: None reported  Social History:  Social History   Substance and Sexual Activity  Alcohol Use No     Social History   Substance and Sexual Activity  Drug Use No    Social History   Socioeconomic  History   Marital status: Legally Separated    Spouse name: Not on file   Number of children: Not on file   Years of education: Not on file   Highest education level: Not on file   Occupational History   Not on file  Tobacco Use   Smoking status: Never   Smokeless tobacco: Not on file  Substance and Sexual Activity   Alcohol use: No   Drug use: No   Sexual activity: Not on file  Other Topics Concern   Not on file  Social History Narrative   Not on file   Social Determinants of Health   Financial Resource Strain: Not on file  Food Insecurity: Patient Unable To Answer (10/04/2022)   Hunger Vital Sign    Worried About Running Out of Food in the Last Year: Patient unable to answer    Ran Out of Food in the Last Year: Patient unable to answer  Transportation Needs: Patient Unable To Answer (10/04/2022)   PRAPARE - Transportation    Lack of Transportation (Medical): Patient unable to answer    Lack of Transportation (Non-Medical): Patient unable to answer  Physical Activity: Not on file  Stress: Not on file  Social Connections: Not on file   Additional Social History:    Allergies:   Allergies  Allergen Reactions   Adhesive [Tape] Dermatitis   Bee Venom    Penicillins     Hives     Labs:  Results for orders placed or performed during the hospital encounter of 10/04/22 (from the past 48 hour(s))  Glucose, capillary     Status: Abnormal   Collection Time: 10/05/22 12:31 AM  Result Value Ref Range   Glucose-Capillary 165 (H) 70 - 99 mg/dL    Comment: Glucose reference range applies only to samples taken after fasting for at least 8 hours.  HIV Antibody (routine testing w rflx)     Status: None   Collection Time: 10/05/22  2:59 AM  Result Value Ref Range   HIV Screen 4th Generation wRfx Non Reactive Non Reactive    Comment: Performed at [facility], [address], [place], [place] [address]  Basic metabolic panel     Status: Abnormal   Collection Time: 10/05/22  2:59 AM  Result Value Ref Range   Sodium 137 135 - 145 mmol/L   Potassium 5.1 3.5 - 5.1 mmol/L   Chloride 105 98 - 111 mmol/L   CO2 24 22 - 32 mmol/L   Glucose, Bld 191 (H) 70 -  99 mg/dL    Comment: Glucose reference range applies only to samples taken after fasting for at least 8 hours.   BUN 25 (H) 6 - 20 mg/dL   Creatinine, Ser 6.04 (H) 0.61 - 1.24 mg/dL    Comment: REPEATED TO VERIFY   Calcium 8.5 (L) 8.9 - 10.3 mg/dL   GFR, Estimated 41 (L) >60 mL/min    Comment: (NOTE) Calculated using the CKD-EPI Creatinine Equation (2021)    Anion gap 8 5 - 15    Comment: Performed at [facility] [address] [place], [place] [address]  Magnesium     Status: None   Collection Time: 10/05/22  2:59 AM  Result Value Ref Range   Magnesium 2.4 1.7 - 2.4 mg/dL    Comment: Performed at [facility], [address] [place], [place] [address]  Phosphorus     Status: None   Collection Time: 10/05/22  2:59 AM  Result Value Ref Range   Phosphorus 3.7 2.5 - 4.6 mg/dL    Comment: Performed at [facility], [address] [place], [place] [address]  CBC     Status: Abnormal   Collection Time: 10/05/22  2:59 AM  Result Value Ref Range   WBC 10.3 4.0 - 10.5 K/uL   RBC 4.42 4.22 - 5.81 MIL/uL   Hemoglobin 12.6 (L) 13.0 - 17.0 g/dL   HCT 81.1 (L) 91.4 - 78.2 %   MCV 86.9 80.0 - 100.0 fL   MCH 28.5 26.0 - 34.0 pg   MCHC 32.8 30.0 - 36.0 g/dL   RDW 95.6 21.3 - 08.6 %   Platelets 276 150 - 400 K/uL   nRBC 0.0 0.0 - 0.2 %    Comment: Performed at [facility], [address] [place], [place] [address]  Glucose, capillary     Status: Abnormal   Collection Time: 10/05/22  4:04 AM  Result Value Ref Range   Glucose-Capillary 169 (H) 70 - 99 mg/dL    Comment: Glucose reference range applies only to samples taken after fasting for at least 8 hours.  Glucose, capillary     Status: Abnormal   Collection Time: 10/05/22  7:59 AM  Result Value Ref Range   Glucose-Capillary 129 (H) 70 - 99 mg/dL    Comment: Glucose reference range applies only to samples taken after fasting for at least 8 hours.  Glucose, capillary     Status: Abnormal    Collection Time: 10/05/22  7:41 PM  Result Value Ref Range   Glucose-Capillary 134 (H) 70 - 99 mg/dL    Comment: Glucose reference range applies only to samples taken after fasting for at least 8 hours.  Glucose, capillary     Status: Abnormal   Collection Time: 10/05/22 11:30 PM  Result Value Ref Range   Glucose-Capillary 144 (H) 70 - 99 mg/dL    Comment: Glucose reference range applies only to samples taken after fasting for at least 8 hours.  Glucose, capillary     Status: Abnormal   Collection Time: 10/06/22  3:23 AM  Result Value Ref Range   Glucose-Capillary 114 (H) 70 - 99 mg/dL    Comment: Glucose reference range applies only to samples taken after fasting for at least 8 hours.  Basic metabolic panel     Status: Abnormal   Collection Time: 10/06/22  4:21 AM  Result Value Ref Range   Sodium 138 135 - 145 mmol/L   Potassium 4.6 3.5 - 5.1 mmol/L   Chloride 102 98 - 111 mmol/L   CO2 29 22 - 32 mmol/L   Glucose, Bld 107 (H) 70 - 99 mg/dL    Comment: Glucose reference range applies only to samples taken after fasting for at least 8 hours.   BUN 22 (H) 6 - 20 mg/dL   Creatinine, Ser 9.62 0.61 - 1.24 mg/dL   Calcium 8.9 8.9 - 95.2 mg/dL   GFR, Estimated >84 >13 mL/min    Comment: (NOTE) Calculated using the CKD-EPI Creatinine Equation (2021)    Anion gap 7 5 - 15    Comment: Performed at [facility], [address] [place], [place] [address]  Magnesium     Status: None   Collection Time: 10/06/22  4:21 AM  Result Value Ref Range   Magnesium 2.4 1.7 - 2.4 mg/dL    Comment: Performed at [facility], [address], [place], [place] [address]  Phosphorus  Status: None   Collection Time: 10/06/22  4:21 AM  Result Value Ref Range   Phosphorus 2.8 2.5 - 4.6 mg/dL    Comment: Performed at [facility], [address] [place], [place] [address]  CBC     Status: Abnormal   Collection Time: 10/06/22  4:21 AM  Result Value Ref Range   WBC 10.1  4.0 - 10.5 K/uL   RBC 4.34 4.22 - 5.81 MIL/uL   Hemoglobin 12.4 (L) 13.0 - 17.0 g/dL   HCT 28.4 (L) 13.2 - 44.0 %   MCV 87.1 80.0 - 100.0 fL   MCH 28.6 26.0 - 34.0 pg   MCHC 32.8 30.0 - 36.0 g/dL   RDW 10.2 72.5 - 36.6 %   Platelets 195 150 - 400 K/uL   nRBC 0.0 0.0 - 0.2 %    Comment: Performed at [facility], [address] [place], [place] [address]  Glucose, capillary     Status: Abnormal   Collection Time: 10/06/22  7:20 AM  Result Value Ref Range   Glucose-Capillary 108 (H) 70 - 99 mg/dL    Comment: Glucose reference range applies only to samples taken after fasting for at least 8 hours.  Glucose, capillary     Status: Abnormal   Collection Time: 10/06/22 12:00 PM  Result Value Ref Range   Glucose-Capillary 154 (H) 70 - 99 mg/dL    Comment: Glucose reference range applies only to samples taken after fasting for at least 8 hours.  Glucose, capillary     Status: Abnormal   Collection Time: 10/06/22  3:49 PM  Result Value Ref Range   Glucose-Capillary 110 (H) 70 - 99 mg/dL    Comment: Glucose reference range applies only to samples taken after fasting for at least 8 hours.    Current Facility-Administered Medications  Medication Dose Route Frequency Provider Last Rate Last Admin   0.9 %  sodium chloride infusion  250 mL Intravenous Continuous Erin Fulling, MD 20 mL/hr at 10/05/22 1540 Infusion Verify at 10/05/22 1540   acetaminophen (TYLENOL) tablet 650 mg  650 mg Oral Q4H PRN Erin Fulling, MD   650 mg at 10/05/22 2115   albuterol (PROVENTIL) (2.5 MG/3ML) 0.083% nebulizer solution 2.5 mg  2.5 mg Nebulization Q4H PRN Charise Killian, MD       amLODipine (NORVASC) tablet 10 mg  10 mg Oral Daily Belia Heman, Kurian, MD   10 mg at 10/06/22 0825   budesonide (PULMICORT) nebulizer solution 0.5 mg  0.5 mg Nebulization BID Erin Fulling, MD   0.5 mg at 10/06/22 2023   docusate sodium (COLACE) capsule 100 mg  100 mg Oral BID PRN Erin Fulling, MD       DULoxetine (CYMBALTA) DR  capsule 60 mg  60 mg Oral Daily Ezequiel Essex, NP   60 mg at 10/06/22 0825   enoxaparin (LOVENOX) injection 40 mg  40 mg Subcutaneous QHS Erin Fulling, MD   40 mg at 10/06/22 2206   feeding supplement (ENSURE ENLIVE / ENSURE PLUS) liquid 237 mL  237 mL Oral TID BM Erin Fulling, MD   237 mL at 10/06/22 2208   hydrALAZINE (APRESOLINE) injection 20 mg  20 mg Intravenous Q10 min PRN Erin Fulling, MD       hydrOXYzine (ATARAX) tablet 50 mg  50 mg Oral TID PRN Jimi Giza H, NP   50 mg at 10/05/22 1658   [START ON 10/07/2022] ipratropium-albuterol (DUONEB) 0.5-2.5 (3) MG/3ML nebulizer solution 3 mL  3  mL Nebulization TID Charise Killian, MD       lamoTRIgine (LAMICTAL) tablet 100 mg  100 mg Oral BID Bonnita Newby H, NP   100 mg at 10/06/22 2208   multivitamin with minerals tablet 1 tablet  1 tablet Oral Daily Erin Fulling, MD   1 tablet at 10/06/22 0825   OLANZapine (ZYPREXA) tablet 5 mg  5 mg Oral QHS Ezequiel Essex, NP   5 mg at 10/06/22 2207   ondansetron [facility]) injection 4 mg  4 mg Intravenous Q6H PRN Erin Fulling, MD   4 mg at 10/06/22 2207   polyethylene glycol (MIRALAX / GLYCOLAX) packet 17 g  17 g Oral Daily PRN Erin Fulling, MD        Musculoskeletal: Strength & Muscle Tone: within normal limits Gait & Station:  Did not assess  Patient leans: N/A            Psychiatric Specialty Exam:  Presentation  General Appearance: Other (comment) [facility] gown)  Eye Contact:Good  Speech:Clear and Coherent  Speech Volume:Normal  Handedness:No data recorded  Mood and Affect  Mood:Euthymic  Affect:Flat   Thought Process  Thought Processes:Coherent  Descriptions of Associations:Intact  Orientation:Full (Time, Place and Person)  Thought Content:Logical; WDL  History of Schizophrenia/Schizoaffective disorder:No data recorded Duration of Psychotic Symptoms:No data recorded Hallucinations:Hallucinations: None  Ideas of Reference:None  Suicidal  Thoughts:Suicidal Thoughts: No  Homicidal Thoughts:Homicidal Thoughts: No   Sensorium  Memory:Immediate Good; Recent Good; Remote Good  Judgment:Fair  Insight:Good   Executive Functions  Concentration:Good  Attention Span:Good  Recall:Good  Fund of Knowledge:Good  Language:Good   Psychomotor Activity  Psychomotor Activity:Psychomotor Activity: Normal   Assets  Assets:Communication Skills; Desire for Improvement; Housing   Sleep  Sleep:Sleep: Fair   Physical Exam: Physical Exam Vitals and nursing note reviewed.  Constitutional:      General: He is not in acute distress. HENT:     Head: Normocephalic.     Nose: Nose normal.  Pulmonary:     Effort: Pulmonary effort is normal. No respiratory distress.  Musculoskeletal:        General: Normal range of motion.     Cervical back: Normal range of motion.  Neurological:     Mental Status: He is alert and oriented to person, place, and time.    Review of Systems  Respiratory:  Negative for shortness of breath.   Psychiatric/Behavioral:  Positive for depression and substance abuse. The patient is nervous/anxious and has insomnia.   All other systems reviewed and are negative.  Blood pressure (!) 144/76, pulse 80, temperature 98.4 F (36.9 C), resp. rate 14, height 5\' 11"  (1.803 m), weight 64.6 kg, SpO2 97%. Body mass index is 19.86 kg/m.  Treatment Plan Summary: Daily contact with patient to assess and evaluate symptoms and progress in treatment, Medication management, and Plan :   Depression and anxiety:    - Restart home medications: Cymbalta, Lamictal, and olanzapine    - Order lamotrigine level to be drawn in the morning 9/10.     - He agrees to chaplain consult for spiritual and emotional needs. Chaplain consult placed. .  Substance use:   - Educate on the potential impact of substance use on mental health and overall well-being    Disposition: Supportive therapy provided about ongoing  stressors.  Norma Fredrickson, NP 10/06/2022 10:57 PM

## 2022-10-05 NOTE — TOC CM/SW Note (Signed)
Cm looked in Care Everywhere and found patient contacts from [facility] in [place]. Cm added the following contacts in the patient contacts   Mother Arnetha Gula [phone] [facility])  Friend Rashitue [place] [phone] [facility])

## 2022-10-05 NOTE — Discharge Instructions (Addendum)
You are encouraged to [facility] and arrange an application appointment to be screened for service to get set up with a physician for primary care  You may print the application and take with you to the appointment. At this web address [email or web address] Please Call [facility] for appointment  [phone]  [address] [place], [place] [address]  Office Hours Monday: Closed Tuesday: 9:00am - 4:00pm Wednesday: 9:00am - 4:00pm Thursday: 9:00am - 8:00pm Friday: Closed  Endocrinology 2nd Thursday of the Month: 5:00pm - 8:00pm  Rheumatology/Orthopedic Clinic By appointment only.  Contact for availability.     Services Not Covered Obstetrics Gastrointestinal/Liver Disease    Mental Health Resources for Therapy  [facility]- [place]  Make an appointment: [phone]  [facility] - Located in [facility]  Make an appointment: [phone]  [facility] at [facility]  Make an appointment: [phone]  [address] [place], [place] [address]  For additional local or virtual therapy appointment options, you can visit [email or web address] and filter by insurance and city to find local providers. Provider profiles include specialties to find the best fit for you!    Please Call this number for additional questions regarding your Medicaid Caseworker and [facility] Department of Social Services [address]  [place], [place] [address]  [phone]    Rent/Utility/Housing  Agency Name: [facility] Address: [address] [place], [place] [address] Phone: [phone] Email: [email or web address] Website: [email or web address] Service(s) Offered: Housing services, self-sufficiency, congregate meal program, weatherization program, Production assistant, radio program, emergency food assistance,  housing counseling, home ownership program, wheels -towork program.  Agency Name: [facility] Address: [address] [place], [place] [address] Phone: [phone] (8a-4p) [phone] (8p- 10p) Email: [email or web address] Website: [email or web address] Service(s) Offered: A program for homeless and/or needy men that includes one-on-one counseling, life skills training and job rehabilitation.  Agency Name: [facility] Address: [address] [place], [place] [address] Phone: [phone] Website: www.alliedchurches.org Service(s) Offered: Assistance to needy in emergency with utility bills, heating fuel, and prescriptions. Shelter for homeless 7pm-7am. May 21, 2016 15  Agency Name: [facility] [place] (Developmentally Disabled) Address: [address], [place], [place] [address] Phone: [phone] Contact Person: Cathleen Corti Email: [email or web address] Website: [email or web address] Service(s) Offered: Helps individuals with developmental disabilities move from housing that is more restrictive to homes where they  can achieve greater independence and have more  opportunities.  Agency Name: [facility] Address: [address], [place], [place] [address] Phone: [phone] Email: [email or web address] Website: www.[email or web address].org Service(s) Offered: Provides affordable housing for low-income families, elderly, and disabled individuals. Offer a wide range of  programs and services, from financial planning to afterschool and summer programs.  Agency Name: [facility] Address: [address] [place], [place] [address] Phone: [phone] Service(s) Offered: Child support services; child welfare services; food stamps; Medicaid; work first family assistance; and aid with fuel,  rent, food and medicine.  Agency  Name: [facility] [place] [employer]. Address: [facility] [address], [place], [place]  [address] Phone: [phone] Website: www.familyabuseservices.org Service(s) Offered: 24 hour Crisis Line: [phone]; 24 hour Emergency Shelter; Transitional Housing; Support Groups; Scientist, physiological; [employer]; Hispanic Outreach: [phone];  Visitation Center: [phone].  Agency Name: [facility] [place]. Address: [address], [place], [place] [address] Phone: [phone] Service(s) Offered: CAP Services; Home and [employer]; Individual  or Group Supports; Respite Care Non-Institutional Nursing;  Residential Supports; Respite Care and Personal Care Services; Transportation; Family and Friends Night; Recreational Activities; Three Nutritious Meals/Snacks; Consultation with Registered Dietician; Twenty-four hour Registered Nurse Access; Daily and [employer]; [employer]; [place] for the [employer] (During Summer Months) Bingo Night (Every  Wednesday Night); Special Populations Dance Night  (Every Tuesday Night); Professional Hair Care Services.  Agency Name: [facility] Address: [address], [place], [place] [address] Phone: [phone] Contact Person: Jabier Mutton Website: [email or web address].Physicist, medical) Offered: Residential treatment facility for women; food and  clothing, educational & employment development and  transportation to work; Counsellor of financial skills;  parenting and family reunification; emotional and spiritual  support; transitional housing for program graduates.  Agency Name: [facility] Address: [address], [place], [place] [address] Phone: [phone] Email: [email or web address] Website: [email or web address].es Service(s) Offered: Public housing units for elderly, disabled, and low income people; housing choice vouchers for income  eligible  applicants; shelter plus care vouchers; and Psychologist, clinical.  Agency Name: [employer] [facility] [employer] [facility] [employer] Address: [address] [place], [place] [address] Phone: [phone] Email: habitat1@[email or web address] .net Website: www.habitatalamance.org Service(s) Offered: Build houses for families in need of decent housing. Each adult in the family must invest 200 hours of labor on  someone else's house, work with volunteers to build their own house, attend classes on budgeting, home maintenance, yard care, and attend homeowner association meetings.  Agency Name: [facility] Address: [address] [place], [place] [address] Phone: [phone] Website: www.rsli.org Service(s) Offered: Intermediate care facilities for intellectually delayed, Supervised Living in group homes for adults with developmental disabilities, Supervised Living for people who have dual diagnoses (MRMI), Independent Living, Supported Living, respite and a variety of CAP services, pre-vocational services, day supports, and [employer].  Agency Name: [place] [employer] Phone: [phone] Website: www.NCForeclosurePrevention.gov Service(s) Offered: Zero-interest, deferred loans to homeowners struggling to pay their mortgage. Call for more information.

## 2022-10-05 NOTE — Progress Notes (Signed)
PHARMACY CONSULT NOTE  Pharmacy Consult for Electrolyte Monitoring and Replacement   Recent Labs: Potassium (mmol/L)  Date Value  10/05/2022 5.1   Magnesium (mg/dL)  Date Value  09/81/1914 2.4   Calcium (mg/dL)  Date Value  78/29/5621 8.5 (L)   Albumin (g/dL)  Date Value  30/86/5784 4.3   Phosphorus (mg/dL)  Date Value  69/62/9528 3.7   Sodium (mmol/L)  Date Value  10/05/2022 137   Assessment: 42 y/o M with medical history including anxiety, asthma, HTN, migraines, pancreatitis, renal disorder admitted with severe asthma exacerbation requiring intubation. Pharmacy consulted to assist with electrolyte monitoring and replacement as indicated.  9/9: AKI noted, K+ near ULN  Goal of Therapy:  Electrolytes within normal limits  Plan:  No electrolyte replacement indicated at this time Follow-up electrolytes with AM labs tomorrow  Tressie Ellis 10/05/2022 8:23 AM

## 2022-10-05 NOTE — Progress Notes (Signed)
Foley catheter, and continuous monitoring removed for transfer to room 211. Prior to transfer pt having increased anxiety resulting in what appears to be a panic attack. Pt not moving much air with very shallow respirations. Pt into tripod position and O2 monitoring replaced. O2 saturation in 50s - D. Delton See, NP to bedside. 6L nasal cannula placed for immediate relief. Pt given relaxation cues and states feelings of relief after a few minutes. Dr Belia Heman given update on pt condition and transfer orders to be placed for step down status. Continuous monitoring replaced.

## 2022-10-05 NOTE — Progress Notes (Signed)
Extubation order written. Cuff leak noted.  Patient extubated  @ 0900 and placed on [place] @ 2lpm.  Patient tolerated well. Will continue to monitor.

## 2022-10-06 DIAGNOSIS — F3289 Other specified depressive episodes: Secondary | ICD-10-CM | POA: Diagnosis not present

## 2022-10-06 DIAGNOSIS — J9602 Acute respiratory failure with hypercapnia: Secondary | ICD-10-CM | POA: Diagnosis not present

## 2022-10-06 DIAGNOSIS — J9601 Acute respiratory failure with hypoxia: Secondary | ICD-10-CM

## 2022-10-06 DIAGNOSIS — F1410 Cocaine abuse, uncomplicated: Secondary | ICD-10-CM | POA: Diagnosis not present

## 2022-10-06 LAB — BASIC METABOLIC PANEL
Anion gap: 7 (ref 5–15)
BUN: 22 mg/dL — ABNORMAL HIGH (ref 6–20)
CO2: 29 mmol/L (ref 22–32)
Calcium: 8.9 mg/dL (ref 8.9–10.3)
Chloride: 102 mmol/L (ref 98–111)
Creatinine, Ser: 1.07 mg/dL (ref 0.61–1.24)
GFR, Estimated: >60 mL/min (ref >60)
Glucose, Bld: 107 mg/dL — ABNORMAL HIGH (ref 70–99)
Potassium: 4.6 mmol/L (ref 3.5–5.1)
Sodium: 138 mmol/L (ref 135–145)

## 2022-10-06 LAB — CBC
HCT: 37.8 % — ABNORMAL LOW (ref 39.0–52.0)
Hemoglobin: 12.4 g/dL — ABNORMAL LOW (ref 13.0–17.0)
MCH: 28.6 pg (ref 26.0–34.0)
MCHC: 32.8 g/dL (ref 30.0–36.0)
MCV: 87.1 fL (ref 80.0–100.0)
Platelets: 195 10*3/uL (ref 150–400)
RBC: 4.34 MIL/uL (ref 4.22–5.81)
RDW: 12.8 % (ref 11.5–15.5)
WBC: 10.1 10*3/uL (ref 4.0–10.5)
nRBC: 0 % (ref 0.0–0.2)

## 2022-10-06 LAB — GLUCOSE, CAPILLARY
Glucose-Capillary: 108 mg/dL — ABNORMAL HIGH (ref 70–99)
Glucose-Capillary: 110 mg/dL — ABNORMAL HIGH (ref 70–99)
Glucose-Capillary: 114 mg/dL — ABNORMAL HIGH (ref 70–99)
Glucose-Capillary: 141 mg/dL — ABNORMAL HIGH (ref 70–99)
Glucose-Capillary: 144 mg/dL — ABNORMAL HIGH (ref 70–99)
Glucose-Capillary: 154 mg/dL — ABNORMAL HIGH (ref 70–99)

## 2022-10-06 LAB — LEGIONELLA PNEUMOPHILA SEROGP 1 UR AG: L. pneumophila Serogp 1 Ur Ag: NEGATIVE

## 2022-10-06 LAB — PHOSPHORUS: Phosphorus: 2.8 mg/dL (ref 2.5–4.6)

## 2022-10-06 LAB — MAGNESIUM: Magnesium: 2.4 mg/dL (ref 1.7–2.4)

## 2022-10-06 MED ORDER — IPRATROPIUM-ALBUTEROL 0.5-2.5 (3) MG/3ML IN SOLN: 3.0000 mL | Freq: Three times a day (TID) | RESPIRATORY_TRACT | Status: DC

## 2022-10-06 MED ORDER — ALBUTEROL SULFATE (2.5 MG/3ML) 0.083% IN NEBU: 2.5000 mg | INHALATION_SOLUTION | RESPIRATORY_TRACT | Status: DC | PRN

## 2022-10-06 MED ORDER — IPRATROPIUM-ALBUTEROL 0.5-2.5 (3) MG/3ML IN SOLN
3.0000 mL | Freq: Three times a day (TID) | RESPIRATORY_TRACT | Status: DC
  Administered 2022-10-07 (×2): 3 mL via RESPIRATORY_TRACT
  Filled 2022-10-06 (×2): qty 3

## 2022-10-06 NOTE — Progress Notes (Signed)
PROGRESS NOTE   HPI was taken from Dr. Belia Heman: 42 y.o. male reports he has a history of asthma.   He ran out of albuterol and he reports he is having increasing shortness of breath for about 3 days   He reports he has had a slight cough.  No fevers or chills.  No chest pain.   He reports he is quite short of breath.  He also has a history of high blood pressure but is run out of all of his medication.  Is not taking any medication or albuterol at this time   Denies history of recent long trip or travel, no leg swelling, no history of blood clots.  He used to smoke.   He has been wheezing PATIENT HAD PROGRESSIVE WOB AND WAS INTUBATED AND PLACED ON KAEVION ROOT [place]  GNF:[id number] DOB: 1980/04/30 DOA: 10/04/2022 PCP: Remigio Eisenmenger, MD  Assessment & Plan:   Principal Problem:   Respiratory failure (HCC) Active Problems:   Grief   Acute stress reaction  Assessment and Plan:  Cocaine abuse: received illicit drug use cessation counseling already. Urine drug screen was positive for cocaine and marijuana. Does not want/need resources to quit using cocaine   Asthma exacerbation: secondary to cocaine abuse. Continue w/ bronchodilators. Completed steroid course. Encourage incentive spirometry  Acute hypoxic & hypercapnic respiratory failure: secondary to asthma exacerbation secondary to cocaine abuse. S/p intubation, ventilation & extubation. Currently saturating in high 90s on RA. Resolved  Hyperglycemia: secondary to steroid use  AKI: resolved  Depression: severity unknown. Continue on lamictal, duloxetine, olanzapine. Psych consulted      DVT prophylaxis: lovenox Code Status: full  Family Communication: pt does not want any family or friends called to be updated Disposition Plan: likely d/c back home   Level of care: Stepdown  Status is: Inpatient Remains inpatient appropriate because: dyspnea & psych still needs to eval pt    Consultants:  ICU Psych    Procedures: s/p intubation, ventilation & extubation   Antimicrobials:   Subjective: Pt c/o shortness of breath   Objective: Vitals:   10/06/22 0729 10/06/22 0731 10/06/22 0824 10/06/22 0900  BP:   (!) 132/90 132/83  Pulse: 67  64 79  Resp: 18  17 17   Temp: 98.3 F (36.8 C)     TempSrc: Oral     SpO2: 96% 97% 97% 97%  Weight:      Height:        Intake/Output Summary (Last 24 hours) at 10/06/2022 0909 Last data filed at 10/06/2022 0830 Gross per 24 hour  Intake 995.56 ml  Output 1175 ml  Net -179.44 ml   Filed Weights   10/04/22 1533 10/05/22 0409 10/06/22 0500  Weight: 61.2 kg 61.2 kg 64.6 kg    Examination:  General exam: Appears calm and comfortable  Respiratory system: diminished breath sounds b/l. Cardiovascular system: S1 & S2+. No  rubs, gallops or clicks.  Gastrointestinal system: Abdomen is nondistended, soft and nontender.  Normal bowel sounds heard. Central nervous system: Alert and oriented. Moves all extremities Psychiatry: Judgement and insight appear normal. Flat mood and affect      Data Reviewed: I have personally reviewed following labs and imaging studies  CBC: Recent Labs  Lab 10/04/22 1159 10/04/22 1454 10/05/22 0259 10/06/22 0421  WBC 6.4 8.7 10.3 10.1  NEUTROABS 4.2  --   --   --   HGB 14.4 14.6 12.6* 12.4*  HCT 43.7 44.6 38.4* 37.8*  MCV 86.9 88.1  86.9 87.1  PLT 256 294 276 195   Basic Metabolic Panel: Recent Labs  Lab 10/04/22 1159 10/04/22 1454 10/05/22 0259 10/06/22 0421  NA 141  --  137 138  K 4.4  --  5.1 4.6  CL 105  --  105 102  CO2 28  --  24 29  GLUCOSE 103*  --  191* 107*  BUN 18  --  25* 22*  CREATININE 1.18 0.98 2.05* 1.07  CALCIUM 9.3  --  8.5* 8.9  MG  --   --  2.4 2.4  PHOS  --   --  3.7 2.8   GFR: Estimated Creatinine Clearance: 82.2 mL/min (by C-G formula based on SCr of 1.07 mg/dL). Liver Function Tests: No results for input(s): "AST", "ALT", "ALKPHOS", "BILITOT", "PROT", "ALBUMIN" in the  last 168 hours. No results for input(s): "LIPASE", "AMYLASE" in the last 168 hours. No results for input(s): "AMMONIA" in the last 168 hours. Coagulation Profile: No results for input(s): "INR", "PROTIME" in the last 168 hours. Cardiac Enzymes: No results for input(s): "CKTOTAL", "CKMB", "CKMBINDEX", "TROPONINI" in the last 168 hours. BNP (last 3 results) No results for input(s): "PROBNP" in the last 8760 hours. HbA1C: No results for input(s): "HGBA1C" in the last 72 hours. CBG: Recent Labs  Lab 10/05/22 0759 10/05/22 1941 10/05/22 2330 10/06/22 0323 10/06/22 0720  GLUCAP 129* 134* 144* 114* 108*   Lipid Profile: No results for input(s): "CHOL", "HDL", "LDLCALC", "TRIG", "CHOLHDL", "LDLDIRECT" in the last 72 hours. Thyroid Function Tests: No results for input(s): "TSH", "T4TOTAL", "FREET4", "T3FREE", "THYROIDAB" in the last 72 hours. Anemia Panel: No results for input(s): "VITAMINB12", "FOLATE", "FERRITIN", "TIBC", "IRON", "RETICCTPCT" in the last 72 hours. Sepsis Labs: No results for input(s): "PROCALCITON", "LATICACIDVEN" in the last 168 hours.  Recent Results (from the past 240 hour(s))  Resp Panel by RT-PCR (Flu A&B, Covid) Anterior Nasal Swab     Status: None   Collection Time: 10/04/22  1:55 PM   Specimen: Anterior Nasal Swab  Result Value Ref Range Status   SARS Coronavirus 2 by RT PCR NEGATIVE NEGATIVE Final    Comment: (NOTE) SARS-CoV-2 target nucleic acids are NOT DETECTED.  The SARS-CoV-2 RNA is generally detectable in upper respiratory specimens during the acute phase of infection. The lowest concentration of SARS-CoV-2 viral copies this assay can detect is 138 copies/mL. A negative result does not preclude SARS-Cov-2 infection and should not be used as the sole basis for treatment or other patient management decisions. A negative result may occur with  improper specimen collection/handling, submission of specimen other than nasopharyngeal swab, presence of  viral mutation(s) within the areas targeted by this assay, and inadequate number of viral copies(<138 copies/mL). A negative result must be combined with clinical observations, patient history, and epidemiological information. The expected result is Negative.  Fact Sheet for Patients:  BloggerCourse.com  Fact Sheet for Healthcare Providers:  SeriousBroker.it  This test is no t yet approved or cleared by the [place] FDA and  has been authorized for detection and/or diagnosis of SARS-CoV-2 by FDA under an Emergency Use Authorization (EUA). This EUA will remain  in effect (meaning this test can be used) for the duration of the COVID-19 declaration under Section 564(b)(1) of the Act, 21 U.S.C.section 360bbb-3(b)(1), unless the authorization is terminated  or revoked sooner.       Influenza A by PCR NEGATIVE NEGATIVE Final   Influenza B by PCR NEGATIVE NEGATIVE Final    Comment: (NOTE) The Xpert Xpress  SARS-CoV-2/FLU/RSV plus assay is intended as an aid in the diagnosis of influenza from Nasopharyngeal swab specimens and should not be used as a sole basis for treatment. Nasal washings and aspirates are unacceptable for Xpert Xpress SARS-CoV-2/FLU/RSV testing.  Fact Sheet for Patients: BloggerCourse.com  Fact Sheet for Healthcare Providers: SeriousBroker.it  This test is not yet approved or cleared by the [place] FDA and has been authorized for detection and/or diagnosis of SARS-CoV-2 by FDA under an Emergency Use Authorization (EUA). This EUA will remain in effect (meaning this test can be used) for the duration of the COVID-19 declaration under Section 564(b)(1) of the Act, 21 U.S.C. section 360bbb-3(b)(1), unless the authorization is terminated or revoked.  Performed at [facility], [address] [place], [place] [address]   MRSA Next Gen by PCR,  Nasal     Status: None   Collection Time: 10/04/22  3:41 PM   Specimen: Nasal Mucosa; Nasal Swab  Result Value Ref Range Status   MRSA by PCR Next Gen NOT DETECTED NOT DETECTED Final    Comment: (NOTE) The GeneXpert MRSA Assay (FDA approved for NASAL specimens only), is one component of a comprehensive MRSA colonization surveillance program. It is not intended to diagnose MRSA infection nor to guide or monitor treatment for MRSA infections. Test performance is not FDA approved in patients less than 19 years old. Performed at [facility], [address] [place], [place] [address]          Radiology Studies: DG Chest Portable 1 View  Result Date: 10/04/2022 CLINICAL DATA:  Acute respiratory failure. Endotracheally intubated orogastric tube placement. EXAM: PORTABLE CHEST 1 VIEW COMPARISON:  10/04/2022 FINDINGS: Endotracheal tube and nasogastric tube are in appropriate position. The heart size and mediastinal contours are within normal limits. Both lungs are clear. The visualized skeletal structures are unremarkable. IMPRESSION: No active disease. Electronically Signed   By: Danae Orleans M.D.   On: 10/04/2022 15:22   DG Chest 2 View  Result Date: 10/04/2022 CLINICAL DATA:  42 year old male with shortness of breath. Asthma exacerbation. EXAM: CHEST - 2 VIEW COMPARISON:  Chest radiographs 02/06/2014 and earlier. FINDINGS: PA and lateral views at 1211 hours. Larger lung volumes. Normal cardiac size and mediastinal contours. Visualized tracheal air column is within normal limits. Both lungs appear clear. No pneumothorax or pleural effusion. Negative visible bowel gas and osseous structures. IMPRESSION: Hyperinflation.  No other acute cardiopulmonary abnormality. Electronically Signed   By: Odessa Fleming M.D.   On: 10/04/2022 12:20        Scheduled Meds:  amLODipine  10 mg Oral Daily   budesonide (PULMICORT) nebulizer solution  0.5 mg Nebulization BID   Chlorhexidine Gluconate Cloth   6 each Topical Q0600   DULoxetine  60 mg Oral Daily   enoxaparin (LOVENOX) injection  40 mg Subcutaneous QHS   feeding supplement  237 mL Oral TID BM   ipratropium-albuterol  3 mL Nebulization Q4H   lamoTRIgine  100 mg Oral BID   multivitamin with minerals  1 tablet Oral Daily   OLANZapine  5 mg Oral QHS   sodium chloride flush  3 mL Intravenous Q12H   Continuous Infusions:  sodium chloride     sodium chloride 20 mL/hr at 10/05/22 1540     LOS: 2 days     Charise Killian, MD [facility] Pager [phone]  If 7PM-7AM, please contact night-coverage [email or web address]  10/06/2022, 9:09 AM

## 2022-10-06 NOTE — Progress Notes (Addendum)
Report given to valaina, patient to be transport via wheelchair once room is ready, tele d/c'd

## 2022-10-06 NOTE — Progress Notes (Signed)
   10/06/22 1300  Spiritual Encounters  Type of Visit Attempt (pt unavailable)  Reason for visit Religious ritual  OnCall Visit No  Spiritual Framework  Presenting Themes Rituals and practive  Patient Stress Factors Not reviewed  Intervention Outcomes  Outcomes Other (comment) (Patient was asleep)  Spiritual Care Plan  Spiritual Care Issues Still Outstanding Chaplain will continue to follow   Patient was asleep during time I tried to visit. Will revisit patient room with in the next 2 hours.

## 2022-10-06 NOTE — Progress Notes (Signed)
PHARMACY CONSULT NOTE  Pharmacy Consult for Electrolyte Monitoring and Replacement   Recent Labs: Potassium (mmol/L)  Date Value  10/06/2022 4.6   Magnesium (mg/dL)  Date Value  08/65/7846 2.4   Calcium (mg/dL)  Date Value  96/29/5284 8.9   Albumin (g/dL)  Date Value  13/24/4010 4.3   Phosphorus (mg/dL)  Date Value  27/25/3664 2.8   Sodium (mmol/L)  Date Value  10/06/2022 138   Assessment: 42 y/o M with medical history including anxiety, asthma, HTN, migraines, pancreatitis, renal disorder admitted with severe asthma exacerbation requiring intubation. Pharmacy consulted to assist with electrolyte monitoring and replacement as indicated.  9/9: AKI noted, K+ near ULN  Goal of Therapy:  Electrolytes within normal limits  Plan:  No electrolyte replacement indicated at this time Follow-up electrolytes with AM labs tomorrow  Elliot Gurney, PharmD, BCPS Clinical Pharmacist  10/06/2022 10:57 AM

## 2022-10-06 NOTE — Progress Notes (Signed)
Attempted to call report, nurse asked if she could call back not ready for report at this time

## 2022-10-07 ENCOUNTER — Other Ambulatory Visit: Payer: Self-pay

## 2022-10-07 DIAGNOSIS — Z79899 Other long term (current) drug therapy: Secondary | ICD-10-CM

## 2022-10-07 DIAGNOSIS — J9601 Acute respiratory failure with hypoxia: Secondary | ICD-10-CM | POA: Diagnosis not present

## 2022-10-07 DIAGNOSIS — J9602 Acute respiratory failure with hypercapnia: Secondary | ICD-10-CM | POA: Diagnosis not present

## 2022-10-07 LAB — GLUCOSE, CAPILLARY
Glucose-Capillary: 97 mg/dL (ref 70–99)
Glucose-Capillary: 109 mg/dL — ABNORMAL HIGH (ref 70–99)
Glucose-Capillary: 140 mg/dL — ABNORMAL HIGH (ref 70–99)
Glucose-Capillary: 128 mg/dL — ABNORMAL HIGH (ref 70–99)

## 2022-10-07 LAB — BASIC METABOLIC PANEL
Anion gap: 5 (ref 5–15)
BUN: 17 mg/dL (ref 6–20)
CO2: 30 mmol/L (ref 22–32)
Calcium: 8.8 mg/dL — ABNORMAL LOW (ref 8.9–10.3)
Chloride: 104 mmol/L (ref 98–111)
Creatinine, Ser: 0.95 mg/dL (ref 0.61–1.24)
GFR, Estimated: >60 mL/min (ref >60)
Glucose, Bld: 94 mg/dL (ref 70–99)
Potassium: 4.4 mmol/L (ref 3.5–5.1)
Sodium: 139 mmol/L (ref 135–145)

## 2022-10-07 LAB — PHOSPHORUS: Phosphorus: 2.9 mg/dL (ref 2.5–4.6)

## 2022-10-07 LAB — MAGNESIUM: Magnesium: 2.4 mg/dL (ref 1.7–2.4)

## 2022-10-07 MED ORDER — DULOXETINE HCL 60 MG PO CPEP
60.0000 mg | ORAL_CAPSULE | Freq: Every day | ORAL | 0 refills | Status: AC
  Filled 2022-10-07: qty 30, 30d supply, fill #0

## 2022-10-07 MED ORDER — ALBUTEROL SULFATE (2.5 MG/3ML) 0.083% IN NEBU
2.5000 mg | INHALATION_SOLUTION | RESPIRATORY_TRACT | 0 refills | Status: AC | PRN
  Filled 2022-10-07: qty 360, 20d supply, fill #0

## 2022-10-07 MED ORDER — ALBUTEROL SULFATE HFA 108 (90 BASE) MCG/ACT IN AERS
2.0000 | INHALATION_SPRAY | Freq: Four times a day (QID) | RESPIRATORY_TRACT | 0 refills | Status: AC | PRN
  Filled 2022-10-07: qty 6.7, 25d supply, fill #0

## 2022-10-07 MED ORDER — HYDROCHLOROTHIAZIDE 25 MG PO TABS
25.0000 mg | ORAL_TABLET | Freq: Every day | ORAL | 0 refills | Status: AC
  Filled 2022-10-07: qty 30, 30d supply, fill #0

## 2022-10-07 MED ORDER — LISINOPRIL 10 MG PO TABS
10.0000 mg | ORAL_TABLET | Freq: Every day | ORAL | 0 refills | Status: AC
  Filled 2022-10-07: qty 30, 30d supply, fill #0

## 2022-10-07 MED ORDER — FLUTICASONE-SALMETEROL 250-50 MCG/ACT IN AEPB
1.0000 | INHALATION_SPRAY | Freq: Two times a day (BID) | RESPIRATORY_TRACT | 0 refills | Status: AC
Start: 2022-10-07 — End: ?
  Filled 2022-10-07: qty 60, 30d supply, fill #0
  Filled 2022-10-07: qty 1, fill #0

## 2022-10-07 MED ORDER — AMLODIPINE BESYLATE 10 MG PO TABS
10.0000 mg | ORAL_TABLET | Freq: Every day | ORAL | 0 refills | Status: AC
  Filled 2022-10-07: qty 30, 30d supply, fill #0

## 2022-10-07 MED ORDER — OLANZAPINE 5 MG PO TABS
5.0000 mg | ORAL_TABLET | Freq: Every day | ORAL | 0 refills | Status: AC
  Filled 2022-10-07: qty 30, 30d supply, fill #0

## 2022-10-07 MED ORDER — LAMOTRIGINE 100 MG PO TABS
100.0000 mg | ORAL_TABLET | Freq: Two times a day (BID) | ORAL | 0 refills | Status: AC
  Filled 2022-10-07: qty 60, 30d supply, fill #0

## 2022-10-07 MED ORDER — MONTELUKAST SODIUM 10 MG PO TABS
10.0000 mg | ORAL_TABLET | Freq: Every day | ORAL | 0 refills | Status: AC
  Filled 2022-10-07: qty 30, 30d supply, fill #0

## 2022-10-07 NOTE — Discharge Summary (Signed)
Physician Discharge Summary   Patient: Patrick Black MRN: [id number] DOB: 03-06-1980  Admit date:     10/04/2022  Discharge date: 10/07/22  Discharge Physician: Lurene Shadow   PCP: Remigio Eisenmenger, MD   Recommendations at discharge:   Follow-up with PCP in 1 to 2 weeks  Discharge Diagnoses: Principal Problem:   Respiratory failure (HCC) Active Problems:   Grief   Acute stress reaction  Resolved Problems:   * No resolved hospital problems. [facility] Course:  Mr. Patrick Black is a 42 year old man with history of asthma, depression, anxiety, cocaine abuse, who presented to the hospital with increasing shortness of breath for about 3 days.  He said he had run out of his asthma medicines.  He was admitted to the hospital for AKI, acute asthma exacerbation complicated by acute hypoxic and hypercapnic respiratory failure.  He required intubation and mechanical ventilation and was managed in the ICU.  He was treated with steroids and bronchodilators.  He was extubated on 10/05/2022.  He was evaluated by the psychiatrist who addressed his psychotropics for depression and anxiety.  His condition improved and he was transferred to the hospitalist service on 10/06/2022.    His condition continued to improve while on the hospitalist service.  He is tolerating room air and he is asymptomatic.  He did not report any suicidal or homicidal ideations.  He has history of depression and anxiety but he did not feel depressed or anxious.  He was reevaluated by the psychiatry team.  He is deemed stable for discharge to home today.  He was given prescriptions for his home medications.  The importance of medical adherence was reiterated.      Consultants: Intensivist, psychiatrist Procedures performed: Intubation and mechanical ventilation on 10/04/2022 Disposition: Home Diet recommendation:  Discharge Diet Orders (From admission, onward)     Start     Ordered   10/07/22 0000  Diet - low sodium  heart healthy        10/07/22 1433           Cardiac diet DISCHARGE MEDICATION: Allergies as of 10/07/2022       Reactions   Adhesive [tape] Dermatitis   Bee Venom    Penicillins    Hives        Medication List     STOP taking these medications    busPIRone 10 MG tablet Commonly known as: BUSPAR   citalopram 40 MG tablet Commonly known as: CELEXA   Fluticasone-Salmeterol 500-50 MCG/DOSE Aepb Commonly known as: ADVAIR   metoprolol tartrate 25 MG tablet Commonly known as: LOPRESSOR   QUEtiapine 300 MG tablet Commonly known as: SEROQUEL   tiZANidine 4 MG tablet Commonly known as: ZANAFLEX   verapamil 80 MG tablet Commonly known as: CALAN       TAKE these medications    albuterol (2.5 MG/3ML) 0.083% nebulizer solution Commonly known as: PROVENTIL Inhale 3 mLs (2.5 mg total) into the lungs every 4 (four) hours as needed for wheezing or shortness of breath.   albuterol 108 (90 Base) MCG/ACT inhaler Commonly known as: Ventolin HFA Inhale 2 puffs into the lungs every 6 (six) hours as needed for wheezing or shortness of breath.   amLODipine 10 MG tablet Commonly known as: NORVASC Take 1 tablet (10 mg total) by mouth daily.   DULoxetine 60 MG capsule Commonly known as: CYMBALTA Take 1 capsule (60 mg total) by mouth daily.   hydrochlorothiazide 25 MG tablet Commonly known as: HYDRODIURIL Take 1 tablet (  25 mg total) by mouth daily.   lamoTRIgine 100 MG tablet Commonly known as: LAMICTAL Take 1 tablet (100 mg total) by mouth 2 (two) times daily.   lisinopril 10 MG tablet Commonly known as: ZESTRIL Take 1 tablet (10 mg total) by mouth daily.   montelukast 10 MG tablet Commonly known as: Singulair Take 1 tablet (10 mg total) by mouth at bedtime.   OLANZapine 5 MG tablet Commonly known as: ZYPREXA Take 1 tablet (5 mg total) by mouth at bedtime.   Symbicort 160-4.5 MCG/ACT inhaler Generic drug: budesonide-formoterol Inhale 2 puffs into the lungs  daily. What changed:  how much to take when to take this        Discharge Exam: Filed Weights   10/05/22 0409 10/06/22 0500 10/07/22 0500  Weight: 61.2 kg 64.6 kg 62.6 kg   GEN: NAD SKIN: Warm and dry EYES: No pallor or icterus ENT: MMM CV: RRR PULM: CTA B ABD: soft, ND, NT, +BS CNS: AAO x 3, non focal EXT: No edema or tenderness   Condition at discharge: good  The results of significant diagnostics from this hospitalization (including imaging, microbiology, ancillary and laboratory) are listed below for reference.   Imaging Studies: DG Chest Portable 1 View  Result Date: 10/04/2022 CLINICAL DATA:  Acute respiratory failure. Endotracheally intubated orogastric tube placement. EXAM: PORTABLE CHEST 1 VIEW COMPARISON:  10/04/2022 FINDINGS: Endotracheal tube and nasogastric tube are in appropriate position. The heart size and mediastinal contours are within normal limits. Both lungs are clear. The visualized skeletal structures are unremarkable. IMPRESSION: No active disease. Electronically Signed   By: Danae Orleans M.D.   On: 10/04/2022 15:22   DG Chest 2 View  Result Date: 10/04/2022 CLINICAL DATA:  42 year old male with shortness of breath. Asthma exacerbation. EXAM: CHEST - 2 VIEW COMPARISON:  Chest radiographs 02/06/2014 and earlier. FINDINGS: PA and lateral views at 1211 hours. Larger lung volumes. Normal cardiac size and mediastinal contours. Visualized tracheal air column is within normal limits. Both lungs appear clear. No pneumothorax or pleural effusion. Negative visible bowel gas and osseous structures. IMPRESSION: Hyperinflation.  No other acute cardiopulmonary abnormality. Electronically Signed   By: Odessa Fleming M.D.   On: 10/04/2022 12:20    Microbiology: Results for orders placed or performed during the hospital encounter of 10/04/22  Resp Panel by RT-PCR (Flu A&B, Covid) Anterior Nasal Swab     Status: None   Collection Time: 10/04/22  1:55 PM   Specimen: Anterior  Nasal Swab  Result Value Ref Range Status   SARS Coronavirus 2 by RT PCR NEGATIVE NEGATIVE Final    Comment: (NOTE) SARS-CoV-2 target nucleic acids are NOT DETECTED.  The SARS-CoV-2 RNA is generally detectable in upper respiratory specimens during the acute phase of infection. The lowest concentration of SARS-CoV-2 viral copies this assay can detect is 138 copies/mL. A negative result does not preclude SARS-Cov-2 infection and should not be used as the sole basis for treatment or other patient management decisions. A negative result may occur with  improper specimen collection/handling, submission of specimen other than nasopharyngeal swab, presence of viral mutation(s) within the areas targeted by this assay, and inadequate number of viral copies(<138 copies/mL). A negative result must be combined with clinical observations, patient history, and epidemiological information. The expected result is Negative.  Fact Sheet for Patients:  BloggerCourse.com  Fact Sheet for Healthcare Providers:  SeriousBroker.it  This test is no t yet approved or cleared by the [place] FDA and  has been  authorized for detection and/or diagnosis of SARS-CoV-2 by FDA under an Emergency Use Authorization (EUA). This EUA will remain  in effect (meaning this test can be used) for the duration of the COVID-19 declaration under Section 564(b)(1) of the Act, 21 U.S.C.section 360bbb-3(b)(1), unless the authorization is terminated  or revoked sooner.       Influenza A by PCR NEGATIVE NEGATIVE Final   Influenza B by PCR NEGATIVE NEGATIVE Final    Comment: (NOTE) The Xpert Xpress SARS-CoV-2/FLU/RSV plus assay is intended as an aid in the diagnosis of influenza from Nasopharyngeal swab specimens and should not be used as a sole basis for treatment. Nasal washings and aspirates are unacceptable for Xpert Xpress SARS-CoV-2/FLU/RSV testing.  Fact Sheet for  Patients: BloggerCourse.com  Fact Sheet for Healthcare Providers: SeriousBroker.it  This test is not yet approved or cleared by the [place] FDA and has been authorized for detection and/or diagnosis of SARS-CoV-2 by FDA under an Emergency Use Authorization (EUA). This EUA will remain in effect (meaning this test can be used) for the duration of the COVID-19 declaration under Section 564(b)(1) of the Act, 21 U.S.C. section 360bbb-3(b)(1), unless the authorization is terminated or revoked.  Performed at [facility], [address] [place], [place] [address]   MRSA Next Gen by PCR, Nasal     Status: None   Collection Time: 10/04/22  3:41 PM   Specimen: Nasal Mucosa; Nasal Swab  Result Value Ref Range Status   MRSA by PCR Next Gen NOT DETECTED NOT DETECTED Final    Comment: (NOTE) The GeneXpert MRSA Assay (FDA approved for NASAL specimens only), is one component of a comprehensive MRSA colonization surveillance program. It is not intended to diagnose MRSA infection nor to guide or monitor treatment for MRSA infections. Test performance is not FDA approved in patients less than 44 years old. Performed at [facility], [address] [place], [place] [address]     Labs: CBC: Recent Labs  Lab 10/04/22 1159 10/04/22 1454 10/05/22 0259 10/06/22 0421  WBC 6.4 8.7 10.3 10.1  NEUTROABS 4.2  --   --   --   HGB 14.4 14.6 12.6* 12.4*  HCT 43.7 44.6 38.4* 37.8*  MCV 86.9 88.1 86.9 87.1  PLT 256 294 276 195   Basic Metabolic Panel: Recent Labs  Lab 10/04/22 1159 10/04/22 1454 10/05/22 0259 10/06/22 0421 10/07/22 0514  NA 141  --  137 138 139  K 4.4  --  5.1 4.6 4.4  CL 105  --  105 102 104  CO2 28  --  24 29 30   GLUCOSE 103*  --  191* 107* 94  BUN 18  --  25* 22* 17  CREATININE 1.18 0.98 2.05* 1.07 0.95  CALCIUM 9.3  --  8.5* 8.9 8.8*  MG  --   --  2.4 2.4 2.4  PHOS  --   --  3.7 2.8 2.9    Liver Function Tests: No results for input(s): "AST", "ALT", "ALKPHOS", "BILITOT", "PROT", "ALBUMIN" in the last 168 hours. CBG: Recent Labs  Lab 10/06/22 1549 10/06/22 2347 10/07/22 0331 10/07/22 0811 10/07/22 1210  GLUCAP 110* 141* 97 109* 140*    Discharge time spent: greater than 30 minutes.  Signed: Lurene Shadow, MD [facility] 10/07/2022

## 2022-10-07 NOTE — Consult Note (Signed)
  Patient noted lying in the bed watching television. Patient A&O x4, calm, pleasant and willing to engage. He reports his recent medication adjustments are effective at current dosages. He denies adverse medication effects, SI/HI/AVH/delusional thought or paranoia.

## 2022-10-07 NOTE — Progress Notes (Signed)
PHARMACY CONSULT NOTE  Pharmacy Consult for Electrolyte Monitoring and Replacement   Recent Labs: Potassium (mmol/L)  Date Value  10/07/2022 4.4   Magnesium (mg/dL)  Date Value  09/81/1914 2.4   Calcium (mg/dL)  Date Value  78/29/5621 8.8 (L)   Albumin (g/dL)  Date Value  30/86/5784 4.3   Phosphorus (mg/dL)  Date Value  69/62/9528 2.9   Sodium (mmol/L)  Date Value  10/07/2022 139   Assessment: 42 y/o M with medical history including anxiety, asthma, HTN, migraines, pancreatitis, renal disorder admitted with severe asthma exacerbation requiring intubation. Pharmacy consulted to assist with electrolyte monitoring and replacement as indicated.   Goal of Therapy:  Electrolytes within normal limits  Plan:  No electrolyte replacement indicated at this time Follow-up electrolytes with AM labs tomorrow  Bari Mantis PharmD Clinical Pharmacist 10/07/2022

## 2022-10-07 NOTE — Plan of Care (Signed)
Patient discharged per MD orders at this time.All dc instructions, education and medications reviewed with the patient.Pt expressed understanding and will comply with dc instructions.follow up appointments was also communicated to the Pt. no verbal c/o or any ssx of distress.Pt was discharged home with self care per order.Pt was transported home by cousin in a privately owned vehicle.

## 2022-10-07 NOTE — Plan of Care (Signed)
  Problem: Pain Managment: Goal: General experience of comfort will improve Outcome: Progressing   Problem: Safety: Goal: Ability to remain free from injury will improve Outcome: Progressing   Problem: Skin Integrity: Goal: Risk for impaired skin integrity will decrease Outcome: Progressing   

## 2022-10-07 NOTE — Plan of Care (Signed)

## 2022-10-08 ENCOUNTER — Other Ambulatory Visit: Payer: Self-pay

## 2022-10-20 ENCOUNTER — Other Ambulatory Visit: Payer: Self-pay
